# Patient Record
Sex: Female | Born: 1940 | Race: White | Hispanic: No | Marital: Married | Smoking: Former smoker
Health system: Southern US, Community
[De-identification: ages and names within clinical notes are randomized; demographics above are authoritative.]

## PROBLEM LIST (undated history)

## (undated) DIAGNOSIS — F419 Anxiety disorder, unspecified: Secondary | ICD-10-CM

## (undated) DIAGNOSIS — R251 Tremor, unspecified: Secondary | ICD-10-CM

## (undated) DIAGNOSIS — I10 Essential (primary) hypertension: Secondary | ICD-10-CM

## (undated) DIAGNOSIS — G20A1 Parkinson's disease without dyskinesia, without mention of fluctuations: Secondary | ICD-10-CM

## (undated) DIAGNOSIS — R42 Dizziness and giddiness: Secondary | ICD-10-CM

## (undated) DIAGNOSIS — E785 Hyperlipidemia, unspecified: Secondary | ICD-10-CM

## (undated) DIAGNOSIS — E039 Hypothyroidism, unspecified: Secondary | ICD-10-CM

## (undated) HISTORY — DX: Tremor, unspecified: R25.1

## (undated) HISTORY — DX: Anxiety disorder, unspecified: F41.9

## (undated) HISTORY — PX: DILATION AND CURETTAGE OF UTERUS: SHX78

## (undated) HISTORY — DX: Dizziness and giddiness: R42

## (undated) HISTORY — PX: WISDOM TOOTH EXTRACTION: SHX21

## (undated) HISTORY — DX: Hyperlipidemia, unspecified: E78.5

---

## 2011-05-12 DIAGNOSIS — E039 Hypothyroidism, unspecified: Secondary | ICD-10-CM | POA: Diagnosis not present

## 2011-05-12 DIAGNOSIS — J90 Pleural effusion, not elsewhere classified: Secondary | ICD-10-CM | POA: Diagnosis not present

## 2011-05-12 DIAGNOSIS — J180 Bronchopneumonia, unspecified organism: Secondary | ICD-10-CM | POA: Diagnosis not present

## 2011-06-22 DIAGNOSIS — E039 Hypothyroidism, unspecified: Secondary | ICD-10-CM | POA: Diagnosis not present

## 2011-06-22 DIAGNOSIS — E7800 Pure hypercholesterolemia, unspecified: Secondary | ICD-10-CM | POA: Diagnosis not present

## 2011-06-22 DIAGNOSIS — I10 Essential (primary) hypertension: Secondary | ICD-10-CM | POA: Diagnosis not present

## 2011-06-25 DIAGNOSIS — I10 Essential (primary) hypertension: Secondary | ICD-10-CM | POA: Diagnosis not present

## 2011-06-25 DIAGNOSIS — E7800 Pure hypercholesterolemia, unspecified: Secondary | ICD-10-CM | POA: Diagnosis not present

## 2011-06-25 DIAGNOSIS — R51 Headache: Secondary | ICD-10-CM | POA: Diagnosis not present

## 2011-06-25 DIAGNOSIS — E669 Obesity, unspecified: Secondary | ICD-10-CM | POA: Diagnosis not present

## 2011-06-25 DIAGNOSIS — F411 Generalized anxiety disorder: Secondary | ICD-10-CM | POA: Diagnosis not present

## 2011-06-25 DIAGNOSIS — E039 Hypothyroidism, unspecified: Secondary | ICD-10-CM | POA: Diagnosis not present

## 2011-07-06 DIAGNOSIS — Z8489 Family history of other specified conditions: Secondary | ICD-10-CM | POA: Diagnosis not present

## 2011-08-06 DIAGNOSIS — E039 Hypothyroidism, unspecified: Secondary | ICD-10-CM | POA: Diagnosis not present

## 2011-08-10 DIAGNOSIS — R109 Unspecified abdominal pain: Secondary | ICD-10-CM | POA: Diagnosis not present

## 2011-08-10 DIAGNOSIS — N3090 Cystitis, unspecified without hematuria: Secondary | ICD-10-CM | POA: Diagnosis not present

## 2011-08-24 DIAGNOSIS — K5732 Diverticulitis of large intestine without perforation or abscess without bleeding: Secondary | ICD-10-CM | POA: Diagnosis not present

## 2011-11-05 DIAGNOSIS — E039 Hypothyroidism, unspecified: Secondary | ICD-10-CM | POA: Diagnosis not present

## 2011-11-05 DIAGNOSIS — I10 Essential (primary) hypertension: Secondary | ICD-10-CM | POA: Diagnosis not present

## 2011-11-05 DIAGNOSIS — E7800 Pure hypercholesterolemia, unspecified: Secondary | ICD-10-CM | POA: Diagnosis not present

## 2011-11-11 DIAGNOSIS — E039 Hypothyroidism, unspecified: Secondary | ICD-10-CM | POA: Diagnosis not present

## 2011-11-11 DIAGNOSIS — E7800 Pure hypercholesterolemia, unspecified: Secondary | ICD-10-CM | POA: Diagnosis not present

## 2011-11-11 DIAGNOSIS — E669 Obesity, unspecified: Secondary | ICD-10-CM | POA: Diagnosis not present

## 2011-11-11 DIAGNOSIS — I10 Essential (primary) hypertension: Secondary | ICD-10-CM | POA: Diagnosis not present

## 2012-01-25 DIAGNOSIS — Z23 Encounter for immunization: Secondary | ICD-10-CM | POA: Diagnosis not present

## 2012-03-14 DIAGNOSIS — I10 Essential (primary) hypertension: Secondary | ICD-10-CM | POA: Diagnosis not present

## 2012-03-14 DIAGNOSIS — E7800 Pure hypercholesterolemia, unspecified: Secondary | ICD-10-CM | POA: Diagnosis not present

## 2012-03-14 DIAGNOSIS — E039 Hypothyroidism, unspecified: Secondary | ICD-10-CM | POA: Diagnosis not present

## 2012-03-21 DIAGNOSIS — F411 Generalized anxiety disorder: Secondary | ICD-10-CM | POA: Diagnosis not present

## 2012-03-21 DIAGNOSIS — I10 Essential (primary) hypertension: Secondary | ICD-10-CM | POA: Diagnosis not present

## 2012-03-21 DIAGNOSIS — E7800 Pure hypercholesterolemia, unspecified: Secondary | ICD-10-CM | POA: Diagnosis not present

## 2012-03-21 DIAGNOSIS — E039 Hypothyroidism, unspecified: Secondary | ICD-10-CM | POA: Diagnosis not present

## 2012-07-25 DIAGNOSIS — I10 Essential (primary) hypertension: Secondary | ICD-10-CM | POA: Diagnosis not present

## 2012-07-25 DIAGNOSIS — E7800 Pure hypercholesterolemia, unspecified: Secondary | ICD-10-CM | POA: Diagnosis not present

## 2012-08-01 DIAGNOSIS — E039 Hypothyroidism, unspecified: Secondary | ICD-10-CM | POA: Diagnosis not present

## 2012-08-01 DIAGNOSIS — F411 Generalized anxiety disorder: Secondary | ICD-10-CM | POA: Diagnosis not present

## 2012-08-01 DIAGNOSIS — I10 Essential (primary) hypertension: Secondary | ICD-10-CM | POA: Diagnosis not present

## 2012-08-01 DIAGNOSIS — N189 Chronic kidney disease, unspecified: Secondary | ICD-10-CM | POA: Diagnosis not present

## 2012-08-01 DIAGNOSIS — E7800 Pure hypercholesterolemia, unspecified: Secondary | ICD-10-CM | POA: Diagnosis not present

## 2012-08-08 DIAGNOSIS — M79609 Pain in unspecified limb: Secondary | ICD-10-CM | POA: Diagnosis not present

## 2012-12-05 DIAGNOSIS — E7800 Pure hypercholesterolemia, unspecified: Secondary | ICD-10-CM | POA: Diagnosis not present

## 2012-12-05 DIAGNOSIS — I10 Essential (primary) hypertension: Secondary | ICD-10-CM | POA: Diagnosis not present

## 2012-12-23 DIAGNOSIS — E039 Hypothyroidism, unspecified: Secondary | ICD-10-CM | POA: Diagnosis not present

## 2012-12-23 DIAGNOSIS — N189 Chronic kidney disease, unspecified: Secondary | ICD-10-CM | POA: Diagnosis not present

## 2012-12-23 DIAGNOSIS — Z23 Encounter for immunization: Secondary | ICD-10-CM | POA: Diagnosis not present

## 2012-12-23 DIAGNOSIS — F411 Generalized anxiety disorder: Secondary | ICD-10-CM | POA: Diagnosis not present

## 2012-12-23 DIAGNOSIS — I10 Essential (primary) hypertension: Secondary | ICD-10-CM | POA: Diagnosis not present

## 2012-12-23 DIAGNOSIS — E7800 Pure hypercholesterolemia, unspecified: Secondary | ICD-10-CM | POA: Diagnosis not present

## 2013-02-08 DIAGNOSIS — Z23 Encounter for immunization: Secondary | ICD-10-CM | POA: Diagnosis not present

## 2013-04-18 DIAGNOSIS — E039 Hypothyroidism, unspecified: Secondary | ICD-10-CM | POA: Diagnosis not present

## 2013-04-18 DIAGNOSIS — E7800 Pure hypercholesterolemia, unspecified: Secondary | ICD-10-CM | POA: Diagnosis not present

## 2013-04-18 DIAGNOSIS — E669 Obesity, unspecified: Secondary | ICD-10-CM | POA: Diagnosis not present

## 2013-04-18 DIAGNOSIS — I10 Essential (primary) hypertension: Secondary | ICD-10-CM | POA: Diagnosis not present

## 2013-04-27 DIAGNOSIS — N189 Chronic kidney disease, unspecified: Secondary | ICD-10-CM | POA: Diagnosis not present

## 2013-04-27 DIAGNOSIS — L570 Actinic keratosis: Secondary | ICD-10-CM | POA: Diagnosis not present

## 2013-04-27 DIAGNOSIS — Z23 Encounter for immunization: Secondary | ICD-10-CM | POA: Diagnosis not present

## 2013-04-27 DIAGNOSIS — E039 Hypothyroidism, unspecified: Secondary | ICD-10-CM | POA: Diagnosis not present

## 2013-04-27 DIAGNOSIS — I10 Essential (primary) hypertension: Secondary | ICD-10-CM | POA: Diagnosis not present

## 2013-04-27 DIAGNOSIS — K2100 Gastro-esophageal reflux disease with esophagitis, without bleeding: Secondary | ICD-10-CM | POA: Diagnosis not present

## 2013-04-27 DIAGNOSIS — F411 Generalized anxiety disorder: Secondary | ICD-10-CM | POA: Diagnosis not present

## 2013-04-27 DIAGNOSIS — E7800 Pure hypercholesterolemia, unspecified: Secondary | ICD-10-CM | POA: Diagnosis not present

## 2013-10-26 DIAGNOSIS — E669 Obesity, unspecified: Secondary | ICD-10-CM | POA: Diagnosis not present

## 2013-10-26 DIAGNOSIS — E7800 Pure hypercholesterolemia, unspecified: Secondary | ICD-10-CM | POA: Diagnosis not present

## 2013-10-26 DIAGNOSIS — E039 Hypothyroidism, unspecified: Secondary | ICD-10-CM | POA: Diagnosis not present

## 2013-10-26 DIAGNOSIS — I10 Essential (primary) hypertension: Secondary | ICD-10-CM | POA: Diagnosis not present

## 2013-10-30 DIAGNOSIS — E039 Hypothyroidism, unspecified: Secondary | ICD-10-CM | POA: Diagnosis not present

## 2013-10-30 DIAGNOSIS — I10 Essential (primary) hypertension: Secondary | ICD-10-CM | POA: Diagnosis not present

## 2013-10-30 DIAGNOSIS — E7800 Pure hypercholesterolemia, unspecified: Secondary | ICD-10-CM | POA: Diagnosis not present

## 2013-10-30 DIAGNOSIS — E669 Obesity, unspecified: Secondary | ICD-10-CM | POA: Diagnosis not present

## 2013-10-30 DIAGNOSIS — K2100 Gastro-esophageal reflux disease with esophagitis, without bleeding: Secondary | ICD-10-CM | POA: Diagnosis not present

## 2013-10-30 DIAGNOSIS — F411 Generalized anxiety disorder: Secondary | ICD-10-CM | POA: Diagnosis not present

## 2013-10-30 DIAGNOSIS — N189 Chronic kidney disease, unspecified: Secondary | ICD-10-CM | POA: Diagnosis not present

## 2013-11-20 DIAGNOSIS — J019 Acute sinusitis, unspecified: Secondary | ICD-10-CM | POA: Diagnosis not present

## 2013-11-20 DIAGNOSIS — J069 Acute upper respiratory infection, unspecified: Secondary | ICD-10-CM | POA: Diagnosis not present

## 2013-12-16 ENCOUNTER — Emergency Department (HOSPITAL_COMMUNITY): Payer: Medicare Other

## 2013-12-16 ENCOUNTER — Encounter (HOSPITAL_COMMUNITY): Payer: Self-pay | Admitting: Emergency Medicine

## 2013-12-16 ENCOUNTER — Inpatient Hospital Stay (HOSPITAL_COMMUNITY)
Admission: EM | Admit: 2013-12-16 | Discharge: 2013-12-18 | DRG: 310 | Disposition: A | Discharge status: Home / Self Care | Payer: Medicare Other | Attending: Internal Medicine | Admitting: Internal Medicine

## 2013-12-16 DIAGNOSIS — I471 Supraventricular tachycardia, unspecified: Principal | ICD-10-CM | POA: Diagnosis present

## 2013-12-16 DIAGNOSIS — E876 Hypokalemia: Secondary | ICD-10-CM | POA: Diagnosis present

## 2013-12-16 DIAGNOSIS — I509 Heart failure, unspecified: Secondary | ICD-10-CM | POA: Diagnosis not present

## 2013-12-16 DIAGNOSIS — T502X5A Adverse effect of carbonic-anhydrase inhibitors, benzothiadiazides and other diuretics, initial encounter: Secondary | ICD-10-CM | POA: Diagnosis present

## 2013-12-16 DIAGNOSIS — R002 Palpitations: Secondary | ICD-10-CM

## 2013-12-16 DIAGNOSIS — E039 Hypothyroidism, unspecified: Secondary | ICD-10-CM | POA: Diagnosis present

## 2013-12-16 DIAGNOSIS — Z87891 Personal history of nicotine dependence: Secondary | ICD-10-CM

## 2013-12-16 DIAGNOSIS — R911 Solitary pulmonary nodule: Secondary | ICD-10-CM | POA: Diagnosis not present

## 2013-12-16 DIAGNOSIS — I10 Essential (primary) hypertension: Secondary | ICD-10-CM | POA: Diagnosis present

## 2013-12-16 DIAGNOSIS — E785 Hyperlipidemia, unspecified: Secondary | ICD-10-CM | POA: Diagnosis present

## 2013-12-16 DIAGNOSIS — I498 Other specified cardiac arrhythmias: Secondary | ICD-10-CM | POA: Insufficient documentation

## 2013-12-16 DIAGNOSIS — I499 Cardiac arrhythmia, unspecified: Secondary | ICD-10-CM | POA: Diagnosis not present

## 2013-12-16 HISTORY — DX: Hypothyroidism, unspecified: E03.9

## 2013-12-16 HISTORY — DX: Essential (primary) hypertension: I10

## 2013-12-16 LAB — CBC
HCT: 41 % (ref 36.0–46.0)
HEMATOCRIT: 40.3 % (ref 36.0–46.0)
HEMOGLOBIN: 13.7 g/dL (ref 12.0–15.0)
Hemoglobin: 13.8 g/dL (ref 12.0–15.0)
MCH: 30 pg (ref 26.0–34.0)
MCH: 29.9 pg (ref 26.0–34.0)
MCHC: 34 g/dL (ref 30.0–36.0)
MCHC: 33.7 g/dL (ref 30.0–36.0)
MCV: 88.4 fL (ref 78.0–100.0)
MCV: 88.9 fL (ref 78.0–100.0)
PLATELETS: 222 10*3/uL (ref 150–400)
Platelets: 230 10*3/uL (ref 150–400)
RBC: 4.56 MIL/uL (ref 3.87–5.11)
RBC: 4.61 MIL/uL (ref 3.87–5.11)
RDW: 13.6 % (ref 11.5–15.5)
RDW: 13.7 % (ref 11.5–15.5)
WBC: 5.9 10*3/uL (ref 4.0–10.5)
WBC: 6.8 10*3/uL (ref 4.0–10.5)

## 2013-12-16 LAB — BASIC METABOLIC PANEL
Anion gap: 14 (ref 5–15)
BUN: 17 mg/dL (ref 6–23)
CO2: 24 meq/L (ref 19–32)
CREATININE: 0.92 mg/dL (ref 0.50–1.10)
Calcium: 9.4 mg/dL (ref 8.4–10.5)
Chloride: 104 mEq/L (ref 96–112)
GFR calc Af Amer: 70 mL/min — ABNORMAL LOW (ref >90)
GFR, EST NON AFRICAN AMERICAN: 60 mL/min — AB (ref >90)
GLUCOSE: 112 mg/dL — AB (ref 70–99)
Potassium: 3.3 mEq/L — ABNORMAL LOW (ref 3.7–5.3)
Sodium: 142 mEq/L (ref 137–147)

## 2013-12-16 LAB — TROPONIN I

## 2013-12-16 LAB — PRO B NATRIURETIC PEPTIDE: Pro B Natriuretic peptide (BNP): <5 pg/mL (ref 0–125)

## 2013-12-16 LAB — MAGNESIUM: Magnesium: 2.2 mg/dL (ref 1.5–2.5)

## 2013-12-16 MED ORDER — SODIUM CHLORIDE 0.9 % IJ SOLN
3.0000 mL | Freq: Two times a day (BID) | INTRAMUSCULAR | Status: DC
  Administered 2013-12-17 – 2013-12-18 (×3): 3 mL via INTRAVENOUS

## 2013-12-16 MED ORDER — HYDRALAZINE HCL 25 MG PO TABS: 25.0000 mg | ORAL_TABLET | Freq: Four times a day (QID) | ORAL | Status: DC | PRN

## 2013-12-16 MED ORDER — POTASSIUM CHLORIDE CRYS ER 20 MEQ PO TBCR
20.0000 meq | EXTENDED_RELEASE_TABLET | Freq: Every day | ORAL | Status: AC
  Administered 2013-12-17 – 2013-12-18 (×2): 20 meq via ORAL
  Filled 2013-12-16 (×2): qty 1

## 2013-12-16 MED ORDER — SODIUM CHLORIDE 0.9 % IJ SOLN: 3.0000 mL | INTRAMUSCULAR | Status: DC | PRN

## 2013-12-16 MED ORDER — GI COCKTAIL ~~LOC~~
30.0000 mL | Freq: Once | ORAL | Status: AC
  Administered 2013-12-16: 30 mL via ORAL
  Filled 2013-12-16: qty 30

## 2013-12-16 MED ORDER — SODIUM CHLORIDE 0.9 % IJ SOLN: 3.0000 mL | Freq: Two times a day (BID) | INTRAMUSCULAR | Status: DC

## 2013-12-16 MED ORDER — ONDANSETRON HCL 4 MG PO TABS: 4.0000 mg | ORAL_TABLET | Freq: Four times a day (QID) | ORAL | Status: DC | PRN

## 2013-12-16 MED ORDER — HYDROCHLOROTHIAZIDE 12.5 MG PO CAPS
12.5000 mg | ORAL_CAPSULE | Freq: Every day | ORAL | Status: DC
  Administered 2013-12-17 – 2013-12-18 (×2): 12.5 mg via ORAL
  Filled 2013-12-16 (×2): qty 1

## 2013-12-16 MED ORDER — POTASSIUM CHLORIDE 20 MEQ/15ML (10%) PO LIQD
40.0000 meq | Freq: Once | ORAL | Status: AC
  Administered 2013-12-16: 40 meq via ORAL
  Filled 2013-12-16: qty 30

## 2013-12-16 MED ORDER — LEVOTHYROXINE SODIUM 75 MCG PO TABS
75.0000 ug | ORAL_TABLET | Freq: Every day | ORAL | Status: DC
  Administered 2013-12-17 – 2013-12-18 (×2): 75 ug via ORAL
  Filled 2013-12-16 (×2): qty 1

## 2013-12-16 MED ORDER — ACETAMINOPHEN 325 MG PO TABS: 650.0000 mg | ORAL_TABLET | Freq: Four times a day (QID) | ORAL | Status: DC | PRN

## 2013-12-16 MED ORDER — ACETAMINOPHEN 650 MG RE SUPP: 650.0000 mg | Freq: Four times a day (QID) | RECTAL | Status: DC | PRN

## 2013-12-16 MED ORDER — ONDANSETRON HCL 4 MG/2ML IJ SOLN: 4.0000 mg | Freq: Four times a day (QID) | INTRAMUSCULAR | Status: DC | PRN

## 2013-12-16 MED ORDER — ENOXAPARIN SODIUM 40 MG/0.4ML ~~LOC~~ SOLN
40.0000 mg | SUBCUTANEOUS | Status: DC
  Administered 2013-12-17 – 2013-12-18 (×2): 40 mg via SUBCUTANEOUS
  Filled 2013-12-16 (×2): qty 0.4

## 2013-12-16 MED ORDER — ATORVASTATIN CALCIUM 10 MG PO TABS
10.0000 mg | ORAL_TABLET | Freq: Every day | ORAL | Status: DC
  Administered 2013-12-17: 10 mg via ORAL
  Filled 2013-12-16: qty 1

## 2013-12-16 MED ORDER — METOPROLOL TARTRATE 25 MG PO TABS
25.0000 mg | ORAL_TABLET | Freq: Two times a day (BID) | ORAL | Status: DC
  Administered 2013-12-17 – 2013-12-18 (×3): 25 mg via ORAL
  Filled 2013-12-16 (×3): qty 1

## 2013-12-16 MED ORDER — SODIUM CHLORIDE 0.9 % IV SOLN: 250.0000 mL | INTRAVENOUS | Status: DC | PRN

## 2013-12-16 NOTE — ED Notes (Signed)
Pt ambulated around nurses station x 2, monitor showed ST with rate of 110 during ambulation.  Pt reports worsening cough with ambulation.  States heart palpitations are felt after sitting down.  Pt denied palpitations after sitting, HR 120 while sitting down after ambulation, lasting briefly for approx 15 sec before coming back down to 110.  nad noted.

## 2013-12-16 NOTE — ED Notes (Signed)
Pt also reports non-productive cough x 2 weeks, denies fevers.

## 2013-12-16 NOTE — ED Notes (Signed)
pts husband to desk stating alarm was going off on monitor.  Upon reviewing monitor, pt had 2 separate episodes of SVT/A-fib lasting approx 3 secs.  Strips printed and edp notified.  Pt in ST at this time with HR 105.

## 2013-12-16 NOTE — ED Notes (Signed)
Report given to Jennifer RN on 300 

## 2013-12-16 NOTE — ED Provider Notes (Signed)
CSN: [id number]     Arrival date & time 12/16/13  1512 History   First MD Initiated Contact with Patient 12/16/13 1624     Chief Complaint  Patient presents with  . Palpitations      HPI Pt was seen at 1625. Per pt and her family, c/o gradual onset and worsening of multiple intermittent episodes of "palpitations" for the past 2 days. Pt states her symptoms were worse overnight last night and she did not sleep well. Describes the palpitations as "my heart beats fast then slows down again" and "my heart does flip-flops." Has been associated with DOE and cough. States "the palpitations make me feel like I have to cough," which also worsens on exertion.  Denies CP, no abd pain, no N/V/D, no back pain, no fevers.    Past Medical History  Diagnosis Date  . Hypertension   . Hypothyroidism    History reviewed. No pertinent past surgical history.  History  Substance Use Topics  . Smoking status: Never Smoker   . Smokeless tobacco: Not on file  . Alcohol Use: No    Review of Systems ROS: Statement: All systems negative except as marked or noted in the HPI; Constitutional: Negative for fever and chills. ; ; Eyes: Negative for eye pain, redness and discharge. ; ; ENMT: Negative for ear pain, hoarseness, nasal congestion, sinus pressure and sore throat. ; ; Cardiovascular: +palpitations, DOE. Negative for chest pain, diaphoresis, and peripheral edema. ; ; Respiratory: +cough. Negative for wheezing and stridor. ; ; Gastrointestinal: Negative for nausea, vomiting, diarrhea, abdominal pain, blood in stool, hematemesis, jaundice and rectal bleeding. . ; ; Genitourinary: Negative for dysuria, flank pain and hematuria. ; ; Musculoskeletal: Negative for back pain and neck pain. Negative for swelling and trauma.; ; Skin: Negative for pruritus, rash, abrasions, blisters, bruising and skin lesion.; ; Neuro: Negative for headache, lightheadedness and neck stiffness. Negative for weakness, altered level of  consciousness , altered mental status, extremity weakness, paresthesias, involuntary movement, seizure and syncope.      Allergies  Review of patient's allergies indicates no known allergies.  Home Medications   Prior to Admission medications   Medication Sig Start Date End Date Taking? Authorizing Provider  CRESTOR 5 MG tablet Take 5 mg by mouth daily. 11/20/13  Yes Historical Provider, MD  levothyroxine (SYNTHROID, LEVOTHROID) 75 MCG tablet Take 75 mcg by mouth daily. 11/20/13  Yes Historical Provider, MD  losartan-hydrochlorothiazide (HYZAAR) 100-25 MG per tablet Take 1 tablet by mouth daily. 09/09/13  Yes Historical Provider, MD   BP 160/81  Pulse 96  Temp(Src) 98.3 F (36.8 C) (Oral)  Resp 23  Ht 5' (1.524 m)  Wt 160 lb (72.576 kg)  BMI 31.25 kg/m2  SpO2 97% Physical Exam 1630: Physical examination:  Nursing notes reviewed; Vital signs and O2 SAT reviewed;  Constitutional: Well developed, Well nourished, Well hydrated, In no acute distress; Head:  Normocephalic, atraumatic; Eyes: EOMI, PERRL, No scleral icterus; ENMT: Mouth and pharynx normal, Mucous membranes moist; Neck: Supple, Full range of motion, No lymphadenopathy; Cardiovascular: Regular rate and rhythm, No gallop; Respiratory: Breath sounds clear & equal bilaterally, No wheezes.  Speaking full sentences with ease, Normal respiratory effort/excursion; Chest: Nontender, Movement normal; Abdomen: Soft, Nontender, Nondistended, Normal bowel sounds; Genitourinary: No CVA tenderness; Extremities: Pulses normal, No tenderness, No edema, No calf edema or asymmetry.; Neuro: AA&Ox3, Major CN grossly intact.  Speech clear. No gross focal motor or sensory deficits in extremities.; Skin: Color normal, Warm, Dry.  ED Course  Procedures     EKG Interpretation   Date/Time:  Saturday December 16 2013 15:22:17 EDT Ventricular Rate:  98 PR Interval:  150 QRS Duration: 99 QT Interval:  357 QTC Calculation: 456 R Axis:   -68 Text  Interpretation:  Sinus rhythm Left axis deviation Probable left  atrial enlargement Abnormal R-wave progression, late transition Left  ventricular hypertrophy Inferior infarct, old No old tracing to compare  Confirmed by [facility]  MD, Nicholos Johns [id number]) on 12/16/2013 4:41:34 PM      MDM  MDM Reviewed: nursing note and vitals Interpretation: labs, ECG and x-ray    Results for orders placed during the hospital encounter of 12/16/13  CBC      Result Value Ref Range   WBC 5.9  4.0 - 10.5 K/uL   RBC 4.56  3.87 - 5.11 MIL/uL   Hemoglobin 13.7  12.0 - 15.0 g/dL   HCT 40.9  81.1 - 91.4 %   MCV 88.4  78.0 - 100.0 fL   MCH 30.0  26.0 - 34.0 pg   MCHC 34.0  30.0 - 36.0 g/dL   RDW 78.2  95.6 - 21.3 %   Platelets 230  150 - 400 K/uL  BASIC METABOLIC PANEL      Result Value Ref Range   Sodium 142  137 - 147 mEq/L   Potassium 3.3 (*) 3.7 - 5.3 mEq/L   Chloride 104  96 - 112 mEq/L   CO2 24  19 - 32 mEq/L   Glucose, Bld 112 (*) 70 - 99 mg/dL   BUN 17  6 - 23 mg/dL   Creatinine, Ser 0.86  0.50 - 1.10 mg/dL   Calcium 9.4  8.4 - 57.8 mg/dL   GFR calc non Af Amer 60 (*) >90 mL/min   GFR calc Af Amer 70 (*) >90 mL/min   Anion gap 14  5 - 15  TROPONIN I      Result Value Ref Range   Troponin I <0.30  <0.30 ng/mL  PRO B NATRIURETIC PEPTIDE      Result Value Ref Range   Pro B Natriuretic peptide (BNP) <5.0  0 - 125 pg/mL   Dg Chest Port 1 View 12/16/2013   CLINICAL DATA:  Heart palpitations  EXAM: PORTABLE CHEST - 1 VIEW  COMPARISON:  None.  FINDINGS: The heart size and mediastinal contours are within normal limits. Both lungs are clear. The visualized skeletal structures show scoliosis of the thoracic spine concave to the left.  IMPRESSION: No active disease.   Electronically Signed   By: Alcide Clever M.D.   On: 12/16/2013 16:00     1825:  Will dose PO potassium. Magnesium and TSH levels added to labs. Pt ambulated with heart monitor. Per ED RN: pt remained sinus tachycardia rate 110's while  walking, but pt had a near continuous cough. Pt then settled back on stretcher with monitor sinus tachycardia rate 120's. Pt's husband called ED RN back into exam room, as pt was c/o "palpitations." HR on monitor 140-160's with new narrow complex tachycardia and new deep T-wave inversions Lead II.  Monitor then returned to NSR, rate 90's. Unable to capture arrythmia by 12-lead EKG. Monitor rhythm strips printed out.  DDx PAF vs PSVT.  T/C to [facility] Cardiology Dr. Tresa Endo, case discussed, including:  HPI, pertinent PM/SHx, VS/PE, dx testing, ED course and treatment:  Admission vs d/c with outpt Cards f/u depending on pt and family's comfort; if pt is uncomfortable with d/c, then  he is agreeable to consult, requests to transfer to [facility] under medicine service.  Dx and testing, as well as d/w Cards MD, d/w pt and family.  Questions answered.  Verb understanding. States they would like to talk privately.  1910:   ED RN and I called back into pt's room: pt and family uncomfortable with d/c given her worsening symptoms overnight last night and today, request admission +/- transfer to [facility] prn. T/C to [facility] Dr. Sharl Ma, case discussed, including:  HPI, pertinent PM/SHx, VS/PE, dx testing, ED course and treatment:  Agreeable to come to ED to evaluate pt for admission at [facility] vs [facility].   1940:  T/C from [facility] Dr. Sharl Ma: he has evaluated pt, states he will keep pt at [facility] for tonight for monitoring and electrolyte replacement, [facility] can transfer prn. Family and pt are agreeable with this plan. Dr. Sharl Ma requests to place temp admission orders, tele bed, team 1.    Samuel Jester, DO 12/18/13 1622

## 2013-12-16 NOTE — ED Notes (Signed)
Pt reports intermittent "fluttering" sensation since last night. Pt reports dyspnea with exertion. nad noted.

## 2013-12-16 NOTE — H&P (Addendum)
PCP:   Estanislado Pandy, MD   Chief Complaint:  Palpitations  HPI: 73 year old female who   has a past medical history of Hypertension and Hypothyroidism. Presents to the ED with chief complaint of palpitations which has been intermittent for the possible days. The symptoms were worse last night and could not sleep well. She denies passing out no chest pain or shortness of breath. But she does have cough and some dyspnea on exertion. Patient was diagnosed with bronchitis a few weeks ago and was prescribed Levaquin. She also has history of hypothyroidism, and takes Synthroid 75 mcg daily. Patient is not aware of last TSH value. No TSH Result found in the epic. Patient also takes losartan/HCTZ and today her potassium was 3.3. In the ED patient was made to walk, and a heart rate went up to 120, and then came back down to 110. She did not pass out our experience chest pain.  Allergies:  No Known Allergies    Past Medical History  Diagnosis Date  . Hypertension   . Hypothyroidism     History reviewed. No pertinent past surgical history.  Prior to Admission medications   Medication Sig Start Date End Date Taking? Authorizing Provider  CRESTOR 5 MG tablet Take 5 mg by mouth daily. 11/20/13  Yes Historical Provider, MD  levothyroxine (SYNTHROID, LEVOTHROID) 75 MCG tablet Take 75 mcg by mouth daily. 11/20/13  Yes Historical Provider, MD  losartan-hydrochlorothiazide (HYZAAR) 100-25 MG per tablet Take 1 tablet by mouth daily. 09/09/13  Yes Historical Provider, MD    Social History:  reports that she has never smoked. She does not have any smokeless tobacco history on file. She reports that she does not drink alcohol or use illicit drugs.  History reviewed. No pertinent family history.   All the positives are listed in BOLD  Review of Systems:  HEENT: Headache, blurred vision, runny nose, sore throat Neck: Hypothyroidism, hyperthyroidism,,lymphadenopathy Chest : Shortness of breath,  history of COPD, Asthma Heart : Chest pain, history of coronary arterey disease GI:  Nausea, vomiting, diarrhea, constipation, GERD GU: Dysuria, urgency, frequency of urination, hematuria Neuro: Stroke, seizures, syncope Psych: Depression, anxiety, hallucinations   Physical Exam: Blood pressure 160/81, pulse 96, temperature 98.3 F (36.8 C), temperature source Oral, resp. rate 23, height 5' (1.524 m), weight 72.576 kg (160 lb), SpO2 97.00%. Constitutional:   Patient is a well-developed and well-nourished female* in no acute distress and cooperative with exam. Head: Normocephalic and atraumatic Mouth: Mucus membranes moist Eyes: PERRL, EOMI, conjunctivae normal Neck: Supple, No Thyromegaly Cardiovascular: RRR, S1 normal, S2 normal Pulmonary/Chest: CTAB, no wheezes, rales, or rhonchi Abdominal: Soft. Non-tender, non-distended, bowel sounds are normal, no masses, organomegaly, or guarding present.  Neurological: A&O x3, Strenght is normal and symmetric bilaterally, cranial nerve II-XII are grossly intact, no focal motor deficit, sensory intact to light touch bilaterally.  Extremities : No Cyanosis, Clubbing or Edema  Labs on Admission:  Basic Metabolic Panel:  Recent Labs Lab 12/16/13 1556  NA 142  K 3.3*  CL 104  CO2 24  GLUCOSE 112*  BUN 17  CREATININE 0.92  CALCIUM 9.4   CBC:  Recent Labs Lab 12/16/13 1556  WBC 5.9  HGB 13.7  HCT 40.3  MCV 88.4  PLT 230   Cardiac Enzymes:  Recent Labs Lab 12/16/13 1556  TROPONINI <0.30    BNP (last 3 results)  Recent Labs  12/16/13 1612  PROBNP <5.0   CBG: No results found for this basename: GLUCAP,  in the last 168 hours  Radiological Exams on Admission: Dg Chest Port 1 View  12/16/2013   CLINICAL DATA:  Heart palpitations  EXAM: PORTABLE CHEST - 1 VIEW  COMPARISON:  None.  FINDINGS: The heart size and mediastinal contours are within normal limits. Both lungs are clear. The visualized skeletal structures show  scoliosis of the thoracic spine concave to the left.  IMPRESSION: No active disease.   Electronically Signed   By: Alcide Clever M.D.   On: 12/16/2013 16:00    EKG: Independently reviewed. Normal sinus rhythm, LVH, borderline QTC prolongation   Assessment/Plan Principal Problem:   Heart palpitations Active Problems:   Paroxysmal cardiac arrhythmia   HTN (hypertension)   Hyperlipidemia  Palpitations Patient started having these symptoms for pulse today's and also has complained of intermittent symptoms in the past. We'll admit the patient, obtain 2-D echocardiogram in a.m.  She also has hypokalemia, likely due to hydrochlorothiazide. We'll replace potassium and check serum magnesium which can cause arrhythmias. Patient also has hypothyroidism and takes Synthroid 75 mcg daily. We'll check TSH. Will also check troponin I every 6 hours x3   Cough  Could be secondary to ARB's, though incidences less with ARB is as compared to ACE inhibitor Will hold losartan  Hypokalemia Replace potassium, and check serum magnesium  Hypothyroidism Check TSH, continue Synthroid 75 mcg daily.  Hypertension Patient is taking losartan HCTZ at home, will hold losartan and cut down the dose of HCTZ to 12.5 mg by mouth daily. Will start metoprolol 25 mg by mouth twice a day, and also add hydralazine 25 mg by mouth every 6 hours when necessary for BP greater than 160/100.  Hyperlipidemia Continue Crestor  DVT prophylaxis Lovenox  Code status: Full code  Family discussion: Admission, patients condition and plan of care including tests being ordered have been discussed with the patient and her niece and husband at bedside* who indicate understanding and agree with the plan and Code Status.  At this time patient is stable, with no acute issues, and do not feel the need to transfer to [place]. Unless patient's condition changes overnight, then we can [facility] her to [facility] as [facility]. I discussed in  detail with patient's family and they're agreeable to stay at [facility] at this time.   Time Spent on Admission: 75 minutes  Alphonza Tramell S [facility] Pager: [phone] 12/16/2013, 7:49 PM  If 7PM-7AM, please contact night-coverage  [email or web address]  Password [email or web address]

## 2013-12-16 NOTE — ED Notes (Signed)
Pt reports heartburn, denies cp.  edp notified, orders received.

## 2013-12-17 DIAGNOSIS — I509 Heart failure, unspecified: Secondary | ICD-10-CM

## 2013-12-17 DIAGNOSIS — I498 Other specified cardiac arrhythmias: Secondary | ICD-10-CM

## 2013-12-17 LAB — COMPREHENSIVE METABOLIC PANEL
ALBUMIN: 3.3 g/dL — AB (ref 3.5–5.2)
ALT: 15 U/L (ref 0–35)
AST: 21 U/L (ref 0–37)
Alkaline Phosphatase: 108 U/L (ref 39–117)
Anion gap: 12 (ref 5–15)
BILIRUBIN TOTAL: 0.3 mg/dL (ref 0.3–1.2)
BUN: 18 mg/dL (ref 6–23)
CHLORIDE: 105 meq/L (ref 96–112)
CO2: 24 mEq/L (ref 19–32)
CREATININE: 0.86 mg/dL (ref 0.50–1.10)
Calcium: 8.8 mg/dL (ref 8.4–10.5)
GFR calc Af Amer: 76 mL/min — ABNORMAL LOW (ref >90)
GFR calc non Af Amer: 65 mL/min — ABNORMAL LOW (ref >90)
Glucose, Bld: 101 mg/dL — ABNORMAL HIGH (ref 70–99)
POTASSIUM: 3.7 meq/L (ref 3.7–5.3)
Sodium: 141 mEq/L (ref 137–147)
TOTAL PROTEIN: 6.5 g/dL (ref 6.0–8.3)

## 2013-12-17 LAB — CREATININE, SERUM
CREATININE: 0.96 mg/dL (ref 0.50–1.10)
GFR calc non Af Amer: 57 mL/min — ABNORMAL LOW (ref >90)
GFR, EST AFRICAN AMERICAN: 66 mL/min — AB (ref >90)

## 2013-12-17 LAB — TSH: TSH: 3.13 u[IU]/mL (ref 0.350–4.500)

## 2013-12-17 LAB — CBC
HCT: 38.1 % (ref 36.0–46.0)
Hemoglobin: 12.6 g/dL (ref 12.0–15.0)
MCH: 29.4 pg (ref 26.0–34.0)
MCHC: 33.1 g/dL (ref 30.0–36.0)
MCV: 89 fL (ref 78.0–100.0)
PLATELETS: 212 10*3/uL (ref 150–400)
RBC: 4.28 MIL/uL (ref 3.87–5.11)
RDW: 13.8 % (ref 11.5–15.5)
WBC: 4.7 10*3/uL (ref 4.0–10.5)

## 2013-12-17 LAB — TROPONIN I
Troponin I: <0.3 ng/mL (ref <0.30)
Troponin I: <0.3 ng/mL (ref <0.30)
Troponin I: <0.3 ng/mL (ref <0.30)

## 2013-12-17 MED ORDER — CALCIUM CARBONATE ANTACID 500 MG PO CHEW
1.0000 | CHEWABLE_TABLET | Freq: Three times a day (TID) | ORAL | Status: DC | PRN
  Administered 2013-12-17 – 2013-12-18 (×2): 200 mg via ORAL
  Filled 2013-12-17 (×2): qty 1

## 2013-12-17 NOTE — Progress Notes (Signed)
Utilization review Completed Shenelle Klas RN BSN   

## 2013-12-17 NOTE — Progress Notes (Signed)
Echocardiogram 2D Echocardiogram has been performed.  Amber Rosales 12/17/2013, 3:58 PM

## 2013-12-17 NOTE — Progress Notes (Signed)
Patient ID: Amber Rosales  female  ZOX:[id number]    DOB: 1941-01-16    DOA: 12/16/2013  PCP: Estanislado Pandy, MD  Assessment/Plan: Principal Problem:   Heart palpitations/  Paroxysmal cardiac arrhythmia : No atrial fibrillation on the monitor, patient reports having palpitations intermittently for last year, used to resolve with coughing but worse over the last couple of days. She denies any chest pain, dyspnea or dizziness. - Continue telemetry, r/o ACS - EKG showed rate 98, normal sinus rhythm - Will check 2-D echocardiogram, TSH. She will likely need further workup with Holter monitor, cardiology evaluation ?EP - Cardiology consulted - Placed on Lopressor 25 mg twice a day, advised to drink less coffee   Active Problems:   HTN (hypertension) - Continue Lopressor, HCTZ    Hyperlipidemia - Continue Lipitor  DVT Prophylaxis: Lovenox  Code Status:  Family Communication: Discussed with patient's daughter and husband at the bedside  Disposition: Hopefully in AM  Consultants:  Cardiology  Procedures:  None  Antibiotics:  None  Subjective: Patient seen and examined, no acute complaints at this time, no palpitations  Objective: Weight change:  No intake or output data in the 24 hours ending 12/17/13 0941 Blood pressure 151/61, pulse 69, temperature 98.3 F (36.8 C), temperature source Oral, resp. rate 20, height 5' (1.524 m), weight 72.576 kg (160 lb), SpO2 97.00%.  Physical Exam: General: Alert and awake, oriented x3, not in any acute distress. CVS: S1-S2 clear, no murmur rubs or gallops Chest: clear to auscultation bilaterally, no wheezing, rales or rhonchi Abdomen: soft nontender, nondistended, normal bowel sounds  Extremities: no cyanosis, clubbing or edema noted bilaterally Neuro: Cranial nerves II-XII intact, no focal neurological deficits  Lab Results: Basic Metabolic Panel:  Recent Labs Lab 12/16/13 1556 12/16/13 1956 12/16/13 2329 12/17/13 0649    NA 142  --   --  141  K 3.3*  --   --  3.7  CL 104  --   --  105  CO2 24  --   --  24  GLUCOSE 112*  --   --  101*  BUN 17  --   --  18  CREATININE 0.92  --  0.96 0.86  CALCIUM 9.4  --   --  8.8  MG  --  2.2  --   --    Liver Function Tests:  Recent Labs Lab 12/17/13 0649  AST 21  ALT 15  ALKPHOS 108  BILITOT 0.3  PROT 6.5  ALBUMIN 3.3*   No results found for this basename: LIPASE, AMYLASE,  in the last 168 hours No results found for this basename: AMMONIA,  in the last 168 hours CBC:  Recent Labs Lab 12/16/13 2329 12/17/13 0649  WBC 6.8 4.7  HGB 13.8 12.6  HCT 41.0 38.1  MCV 88.9 89.0  PLT 222 212   Cardiac Enzymes:  Recent Labs Lab 12/16/13 1556 12/16/13 2329 12/17/13 0649  TROPONINI <0.30 <0.30 <0.30   BNP: No components found with this basename: POCBNP,  CBG: No results found for this basename: GLUCAP,  in the last 168 hours   Micro Results: No results found for this or any previous visit (from the past 240 hour(s)).  Studies/Results: Dg Chest Port 1 View  12/16/2013   CLINICAL DATA:  Heart palpitations  EXAM: PORTABLE CHEST - 1 VIEW  COMPARISON:  None.  FINDINGS: The heart size and mediastinal contours are within normal limits. Both lungs are clear. The visualized skeletal structures show scoliosis of the  thoracic spine concave to the left.  IMPRESSION: No active disease.   Electronically Signed   By: Alcide Clever M.D.   On: 12/16/2013 16:00    Medications: Scheduled Meds: . atorvastatin  10 mg Oral q1800  . enoxaparin (LOVENOX) injection  40 mg Subcutaneous Q24H  . hydrochlorothiazide  12.5 mg Oral Daily  . levothyroxine  75 mcg Oral Q breakfast  . metoprolol tartrate  25 mg Oral BID  . potassium chloride  20 mEq Oral Daily  . sodium chloride  3 mL Intravenous Q12H  . sodium chloride  3 mL Intravenous Q12H      LOS: 1 day   RAI,RIPUDEEP M.D. [facility] 12/17/2013, 9:41 AM Pager: [phone]  If 7PM-7AM, please contact  night-coverage [email or web address] Password [email or web address]  **Disclaimer: This note was dictated with voice recognition software. Similar sounding words can inadvertently be transcribed and this note may contain transcription errors which may not have been corrected upon publication of note.**

## 2013-12-18 ENCOUNTER — Inpatient Hospital Stay (HOSPITAL_COMMUNITY): Payer: Medicare Other

## 2013-12-18 DIAGNOSIS — I471 Supraventricular tachycardia: Secondary | ICD-10-CM | POA: Diagnosis present

## 2013-12-18 LAB — D-DIMER, QUANTITATIVE (NOT AT ARMC): D DIMER QUANT: 0.97 ug{FEU}/mL — AB (ref 0.00–0.48)

## 2013-12-18 MED ORDER — METOPROLOL TARTRATE 25 MG PO TABS: 25.0000 mg | ORAL_TABLET | Freq: Two times a day (BID) | ORAL | Status: DC

## 2013-12-18 MED ORDER — IOHEXOL 350 MG/ML SOLN
100.0000 mL | Freq: Once | INTRAVENOUS | Status: AC | PRN
  Administered 2013-12-18: 100 mL via INTRAVENOUS

## 2013-12-18 MED ORDER — LOSARTAN POTASSIUM 100 MG PO TABS: 100.0000 mg | ORAL_TABLET | Freq: Every day | ORAL | Status: DC

## 2013-12-18 MED ORDER — FUROSEMIDE 20 MG PO TABS: 20.0000 mg | ORAL_TABLET | ORAL | Status: DC | PRN

## 2013-12-18 NOTE — Care Management Note (Signed)
    Page 1 of 1   12/18/2013     4:38:12 PM CARE MANAGEMENT NOTE 12/18/2013  Patient:  Amber Rosales,Amber Rosales   Account Number:  [id number]  Date Initiated:  12/18/2013  Documentation initiated by:  Kathyrn Sheriff  Subjective/Objective Assessment:   Pt is from home with self care.     Action/Plan:   Pt plans to discharge home with self care today. Pt has no CM needs at this time.   Anticipated DC Date:  12/18/2013   Anticipated DC Plan:  HOME/SELF CARE      DC Planning Services  CM consult      Choice offered to / List presented to:             Status of service:  Completed, signed off Medicare Important Message given?   (If response is "NO", the following Medicare IM given date fields will be blank) Date Medicare IM given:   Medicare IM given by:   Date Additional Medicare IM given:   Additional Medicare IM given by:    Discharge Disposition:  HOME/SELF CARE  Per UR Regulation:    If discussed at Long Length of Stay Meetings, dates discussed:    Comments:  12/18/2013 1600 Kathyrn Sheriff, RN, MSN, [facility]

## 2013-12-18 NOTE — Progress Notes (Signed)
IV removed. Discharge instruction reviewed with patient and husband. Understanding verbalized. Taken out via wheelchair for discharge home.

## 2013-12-18 NOTE — Consult Note (Signed)
Consulting cardiologist: Dr Dina Rich   Clinical Summary Amber Rosales is a 73 y.o.female hx of HTN, hypothyroidism admitted with palpitations.Reports history of occasional palpitations for sometime, but significant increase in frequency and severity starting on Friday. Denies any chest pain, no significant SOB. Drinks only one cup of coffee daily, no tea, sodas, energy drinks or EtOH   K 3.3, Mg 2.2, TSH 3.1, pro BNP negative, trop neg x 4.  CXR no acute process EKG SR, incomplete RBBB, LAD, LAE, Q-waves inferior leads, poor R-wave progression Echo LVEF 60-65%, grade I diastolic dysfunction, moderate RA and RV enlargement, normal RV function. Not able to estimate PASP due to inadequate TR jet, normal IVC.   No Known Allergies  Medications Scheduled Medications: . atorvastatin  10 mg Oral q1800  . enoxaparin (LOVENOX) injection  40 mg Subcutaneous Q24H  . hydrochlorothiazide  12.5 mg Oral Daily  . levothyroxine  75 mcg Oral Q breakfast  . metoprolol tartrate  25 mg Oral BID  . sodium chloride  3 mL Intravenous Q12H  . sodium chloride  3 mL Intravenous Q12H     Infusions:     PRN Medications:  sodium chloride, acetaminophen, acetaminophen, calcium carbonate, ondansetron (ZOFRAN) IV, ondansetron, sodium chloride   Past Medical History  Diagnosis Date  . Hypertension   . Hypothyroidism     History reviewed. No pertinent past surgical history.  History reviewed. No pertinent family history.  Social History Amber Rosales reports that she quit smoking about 56 years ago. Her smoking use included Cigarettes. She smoked 0.00 packs per day for 10 years. She does not have any smokeless tobacco history on file. Amber Rosales reports that she does not drink alcohol.  Review of Systems CONSTITUTIONAL: No weight loss, fever, chills, weakness or fatigue.  HEENT: Eyes: No visual loss, blurred vision, double vision or yellow sclerae. No hearing loss, sneezing, congestion, runny  nose or sore throat.  SKIN: No rash or itching.  CARDIOVASCULAR: per HPI.  RESPIRATORY: No shortness of breath, cough or sputum.  GASTROINTESTINAL: No anorexia, nausea, vomiting or diarrhea. No abdominal pain or blood.  GENITOURINARY: no polyuria, no dysuria NEUROLOGICAL: No headache, dizziness, syncope, paralysis, ataxia, numbness or tingling in the extremities. No change in bowel or bladder control.  MUSCULOSKELETAL: No muscle, back pain, joint pain or stiffness.  HEMATOLOGIC: No anemia, bleeding or bruising.  LYMPHATICS: No enlarged nodes. No history of splenectomy.  PSYCHIATRIC: No history of depression or anxiety.      Physical Examination Blood pressure 134/53, pulse 68, temperature 98 F (36.7 C), temperature source Oral, resp. rate 20, height 5' (1.524 m), weight 160 lb (72.576 kg), SpO2 98.00%.  Intake/Output Summary (Last 24 hours) at 12/18/13 1051 Last data filed at 12/17/13 1800  Gross per 24 hour  Intake    480 ml  Output      0 ml  Net    480 ml    HEENT: Sclera clear  Cardiovascular: RRR, no m/r/g, no JVD, no carotid bruits  Respiratory: CTAB  GI: abdomen soft, NT, ND  MSK: no LE edema  Neuro: no focal deficits  Psych: appropriate affect   Lab Results  Basic Metabolic Panel:  Recent Labs Lab 12/16/13 1556 12/16/13 1956 12/16/13 2329 12/17/13 0649  NA 142  --   --  141  K 3.3*  --   --  3.7  CL 104  --   --  105  CO2 24  --   --  24  GLUCOSE 112*  --   --  101*  BUN 17  --   --  18  CREATININE 0.92  --  0.96 0.86  CALCIUM 9.4  --   --  8.8  MG  --  2.2  --   --     Liver Function Tests:  Recent Labs Lab 12/17/13 0649  AST 21  ALT 15  ALKPHOS 108  BILITOT 0.3  PROT 6.5  ALBUMIN 3.3*    CBC:  Recent Labs Lab 12/16/13 1556 12/16/13 2329 12/17/13 0649  WBC 5.9 6.8 4.7  HGB 13.7 13.8 12.6  HCT 40.3 41.0 38.1  MCV 88.4 88.9 89.0  PLT 230 222 212    Cardiac Enzymes:  Recent Labs Lab 12/16/13 1556 12/16/13 2329  12/17/13 0649 12/17/13 1120  TROPONINI <0.30 <0.30 <0.30 <0.30    BNP: No components found with this basename: POCBNP,     Impression/Recommendations 1. Palpitations - PSVT, episodes of narrow complex tach with short RP up to 140 bpm - possibly exacerbated by low K, this has been repleted. Given low K on admission and since we are starting metoprolol as other antihypertensive would not restart her HCTZ. Would offer Lasix  prn swelling as she does have some occasional LE edema - agree with starting metoprolol for suppression - since rhythm identified on telemetry, no indication for home monitor at this time  2. HTN - would stop HCTZ, continue metoprolol and losartan at discharge  3. Right sided chamber enlargement - noted in apical views on echo, less impressive on subcostal views. Normal RV function - unable to measure pulm systolic pressure due to inadequate TR jet - unclear etiology at this time. She is obese with BMI 31, snores and reports some daytime somnolence and has HTN and arrhtyhmias. Should be considered for outpatient sleep test. Also prior hx of COPD, may consider PFTs at some point.   Will sign off of inpatient care. I have called our office for her to follow up with [place] in 2 weeks with NP Junious Silk, M.D., F.A.C.C.

## 2013-12-18 NOTE — Discharge Summary (Signed)
Physician Discharge Summary  Patient ID: Amber Rosales MRN: [id number] DOB/AGE: 73-Oct-1942 73 y.o.  Admit date: 12/16/2013 Discharge date: 12/18/2013  Primary Care Physician:  Estanislado Pandy, MD  Discharge Diagnoses:    . Paroxysmal SVT . Heart palpitations . HTN (hypertension) . Hyperlipidemia  Consults: Cardiology, Dr Wyline Mood  Recommendations for outpatient follow up Patient has a small right upper lobe nodule 7 mm, outpatient surveillance with chest CT commended   Allergies:  No Known Allergies   Discharge Medications:   Medication List    STOP taking these medications       losartan-hydrochlorothiazide 100-25 MG per tablet  Commonly known as:  HYZAAR      TAKE these medications       CRESTOR 5 MG tablet  Generic drug:  rosuvastatin  Take 5 mg by mouth daily.     furosemide 20 MG tablet  Commonly known as:  LASIX  Take 1 tablet (20 mg total) by mouth as needed for fluid or edema.     levothyroxine 75 MCG tablet  Commonly known as:  SYNTHROID, LEVOTHROID  Take 75 mcg by mouth daily.     losartan 100 MG tablet  Commonly known as:  COZAAR  Take 1 tablet (100 mg total) by mouth daily.     metoprolol tartrate 25 MG tablet  Commonly known as:  LOPRESSOR  Take 1 tablet (25 mg total) by mouth 2 (two) times daily.         Brief H and P: For complete details please refer to admission H and P, but in brief 73 year old female who has a past medical history of Hypertension and Hypothyroidism.  Presented to the ED with chief complaint of palpitations which has been intermittent for the possible days. The symptoms were worse last night and could not sleep well. She denies passing out no chest pain or shortness of breath. But she does have cough and some dyspnea on exertion. Patient was diagnosed with bronchitis a few weeks ago and was prescribed Levaquin. She also has history of hypothyroidism, and takes Synthroid 75 mcg daily. Patient is not aware of last TSH value.  Patient also takes losartan/HCTZ and today her potassium was 3.3.  In the ED patient was made to walk, and a heart rate went up to 120, and then came back down to 110. She did not pass out our experience chest pain.   Hospital Course:  Heart palpitations/ paroxysmal SVT  No atrial fibrillation on the monitor, patient reports having palpitations intermittently for last year, used to resolve with coughing but worse over the last couple of days. She denied any chest pain, dyspnea or dizziness. Patient was placed on telemetry to rule out any cardiac arrhythmias. Patient was found to have had episodes of narrow complex tachycardia with short RP up to 140 beats per minute. Per cardiology, possibly exacerbated by hypokalemia. Recommended continuing beta blocker which was started during this admission. HCTZ were discontinued. Patient was recommended to use her Lasix 20 mg as needed for swelling/pedal edema. Per cardiology, continue metoprolol and losartan.  She was ruled out for acute ACS. EKG showed rate 98, normal sinus rhythm. 2-D echo showed EF of 60-65%, grade 1 diastolic dysfunction, moderately dilated right ventricle, right atrium, Patient was recommended outpatient followup.    HTN (hypertension) - Continue Lopressor, losartan  Hyperlipidemia - Continue Lipitor    Day of Discharge BP 146/91  Pulse 77  Temp(Src) 98.4 F (36.9 C) (Oral)  Resp 19  Ht 5' (1.524 m)  Wt 72.576 kg (160 lb)  BMI 31.25 kg/m2  SpO2 98%  Physical Exam: General: Alert and awake oriented x3 not in any acute distress. HEENT: anicteric sclera, pupils reactive to light and accommodation CVS: S1-S2 clear no murmur rubs or gallops Chest: clear to auscultation bilaterally, no wheezing rales or rhonchi Abdomen: soft nontender, nondistended, normal bowel sounds Extremities: no cyanosis, clubbing or edema noted bilaterally Neuro: Cranial nerves II-XII intact, no focal neurological deficits   The results of  significant diagnostics from this hospitalization (including imaging, microbiology, ancillary and laboratory) are listed below for reference.    LAB RESULTS: Basic Metabolic Panel:  Recent Labs Lab 12/16/13 1556 12/16/13 1956 12/16/13 2329 12/17/13 0649  NA 142  --   --  141  K 3.3*  --   --  3.7  CL 104  --   --  105  CO2 24  --   --  24  GLUCOSE 112*  --   --  101*  BUN 17  --   --  18  CREATININE 0.92  --  0.96 0.86  CALCIUM 9.4  --   --  8.8  MG  --  2.2  --   --    Liver Function Tests:  Recent Labs Lab 12/17/13 0649  AST 21  ALT 15  ALKPHOS 108  BILITOT 0.3  PROT 6.5  ALBUMIN 3.3*   No results found for this basename: LIPASE, AMYLASE,  in the last 168 hours No results found for this basename: AMMONIA,  in the last 168 hours CBC:  Recent Labs Lab 12/16/13 2329 12/17/13 0649  WBC 6.8 4.7  HGB 13.8 12.6  HCT 41.0 38.1  MCV 88.9 89.0  PLT 222 212   Cardiac Enzymes:  Recent Labs Lab 12/17/13 0649 12/17/13 1120  TROPONINI <0.30 <0.30   BNP: No components found with this basename: POCBNP,  CBG: No results found for this basename: GLUCAP,  in the last 168 hours  Significant Diagnostic Studies:  Dg Chest Port 1 View  12/16/2013   CLINICAL DATA:  Heart palpitations  EXAM: PORTABLE CHEST - 1 VIEW  COMPARISON:  None.  FINDINGS: The heart size and mediastinal contours are within normal limits. Both lungs are clear. The visualized skeletal structures show scoliosis of the thoracic spine concave to the left.  IMPRESSION: No active disease.   Electronically Signed   By: Alcide Clever M.D.   On: 12/16/2013 16:00    2D ECHO: Study Conclusions  - Left ventricle: The cavity size was normal. Wall thickness was increased increased in a pattern of mild to moderate LVH. Systolic function was normal. The estimated ejection fraction was in the range of 60% to 65%. Doppler parameters are consistent with abnormal left ventricular relaxation (grade 1  diastolic dysfunction). - Aortic valve: Valve area (VTI): 2.42 cm^2. Valve area (Vmax): 2.54 cm^2. - Right ventricle: The cavity size was moderately dilated. - Right atrium: The atrium was moderately to severely dilated. - Technically adequate study.    Disposition and Follow-up: Discharge Instructions   Diet - low sodium heart healthy    Complete by:  As directed      Increase activity slowly    Complete by:  As directed             DISPOSITION: HOME   DIET: HEART HEALTHY    DISCHARGE FOLLOW-UP Follow-up Information   Follow up with Estanislado Pandy, MD On 12/26/2013. (Your appointment is at 11:15 am.)    Specialty:  Family Medicine   Contact information:   [address] [place] [address] [phone]       Follow up with Joni Reining, NP On 01/01/2014. (Your appointment is at 1:30 pm.)    Specialty:  Nurse Practitioner   Contact information:   [address] [place] [address] [phone]       Time spent on Discharge: 35 MINS  Signed:   Joel Mericle M.D. [facility] 12/18/2013, 3:45 PM Pager: [phone]   **Disclaimer: This note was dictated with voice recognition software. Similar sounding words can inadvertently be transcribed and this note may contain transcription errors which may not have been corrected upon publication of note.**

## 2013-12-26 DIAGNOSIS — Z23 Encounter for immunization: Secondary | ICD-10-CM | POA: Diagnosis not present

## 2013-12-29 NOTE — Progress Notes (Signed)
    HPI: Amber Rosales is a 73 year old patient of Dr. Wyline Mood. That we are following for ongoing assessment and management of hypertension, palpitations, with history of hypothyroidism. She was recently admitted to any pain hospital for complaints of increased severity of palpitations without associated chest pain, shortness of breath, or dizziness.  Echocardiogram was completed during hospitalization revealing a normal systolic function with 80 F. of 60-65%, with grade 1 diastolic dysfunction. The right atrium was moderately to severely dilated. The right ventricle cavity size is moderately dilated. There is no evidence of valvular disease.  During hospitalization, the patient was found to have episodes of narrow complex tachycardia with a short RP up to 140 beats per minute. It was felt that this was exacerbated by hypokalemia with a mission, potassium of 3.3. She was continued on metoprolol 25 mg twice a day, along with antihypertensive losartan 100 mg daily. She is also sent home on 20 mg of Lasix. But I do not find a discharge summary that she was also given potassium replacement. On discharge the patient's potassium was 3.7. Creatinine 0.86. She is here for post hospitalization followup.  She denies recurrent palpitations on metoprolol. She has gained 14 lbs by our scales compared to the discharge weight. She has not used the lasix since being discharged. She is having some complaints of DOE, but no chest pain, no edema.   No Known Allergies  Current Outpatient Prescriptions  Medication Sig Dispense Refill  . CRESTOR 5 MG tablet Take 5 mg by mouth daily.      . furosemide (LASIX) 20 MG tablet Take 1 tablet (20 mg total) by mouth as needed for fluid or edema.  30 tablet  2  . levothyroxine (SYNTHROID, LEVOTHROID) 75 MCG tablet Take 75 mcg by mouth daily.      Marland Kitchen losartan (COZAAR) 100 MG tablet Take 1 tablet (100 mg total) by mouth daily.  30 tablet  3  . metoprolol tartrate (LOPRESSOR) 25 MG  tablet Take 1 tablet (25 mg total) by mouth 2 (two) times daily.  60 tablet  3   No current facility-administered medications for this visit.    Past Medical History  Diagnosis Date  . Hypertension   . Hypothyroidism     History reviewed. No pertinent past surgical history.  ROS: Review of systems complete and found to be negative unless listed above  PHYSICAL EXAM BP 158/102  Pulse 73  Ht  (1.549 m)  Wt 174 lb (78.926 kg)  BMI 32.89 kg/m2  SpO2 98%  General: Well developed, well nourished, in no acute distress Head: Eyes PERRLA, No xanthomas.   Normal cephalic and atramatic  Lungs: Clear bilaterally to auscultation and percussion. Heart: HRRR S1 S2, 1/6 systolic murmur,Soft S4.   Pulses are 2+ & equal.            No carotid bruit. No JVD.  No abdominal bruits. No femoral bruits. Abdomen: Bowel sounds are positive, abdomen soft and non-tender without masses or                  Hernia's noted. obese Msk:  Back normal, normal gait. Normal strength and tone for age. Extremities: No clubbing, cyanosis or edema.  DP +1 Neuro: Alert and oriented X 3. Psych:  Good affect, responds appropriately  ASSESSMENT AND PLAN

## 2014-01-01 ENCOUNTER — Encounter: Payer: Self-pay | Admitting: Adult Health

## 2014-01-01 ENCOUNTER — Ambulatory Visit (INDEPENDENT_AMBULATORY_CARE_PROVIDER_SITE_OTHER): Payer: Medicare Other | Admitting: Adult Health

## 2014-01-01 VITALS — BP 158/102 | HR 73 | Ht 61.0 in | Wt 174.0 lb

## 2014-01-01 DIAGNOSIS — I471 Supraventricular tachycardia: Secondary | ICD-10-CM | POA: Diagnosis not present

## 2014-01-01 DIAGNOSIS — E785 Hyperlipidemia, unspecified: Secondary | ICD-10-CM | POA: Diagnosis not present

## 2014-01-01 DIAGNOSIS — I10 Essential (primary) hypertension: Secondary | ICD-10-CM | POA: Diagnosis not present

## 2014-01-01 MED ORDER — HYDROCHLOROTHIAZIDE 25 MG PO TABS: 25.0000 mg | ORAL_TABLET | Freq: Every day | ORAL | Status: DC

## 2014-01-01 NOTE — Assessment & Plan Note (Signed)
Continue low cholesterol diet and crestor. Will recheck status in 3 months.

## 2014-01-01 NOTE — Progress Notes (Deleted)
Name: Amber Rosales    DOB: 07-01-1940  Age: 73 y.o.  MR#: [id number]       PCP:  Estanislado Pandy, MD      Insurance: Payor: MEDICARE / Plan: MEDICARE PART A AND B / Product Type: *No Product type* /   CC:    Chief Complaint  Patient presents with  . Hypertension  . Palpitations    VS Filed Vitals:   01/01/14 1328  BP: 158/102  Pulse: 73  Height:  (1.549 m)  Weight: 174 lb (78.926 kg)  SpO2: 98%    Weights Current Weight  01/01/14 174 lb (78.926 kg)  12/16/13 160 lb (72.576 kg)    Blood Pressure  BP Readings from Last 3 Encounters:  01/01/14 158/102  12/18/13 146/91     Admit date:  (Not on file) Last encounter with RMR:  Visit date not found   Allergy Review of patient's allergies indicates no known allergies.  Current Outpatient Prescriptions  Medication Sig Dispense Refill  . CRESTOR 5 MG tablet Take 5 mg by mouth daily.      . furosemide (LASIX) 20 MG tablet Take 1 tablet (20 mg total) by mouth as needed for fluid or edema.  30 tablet  2  . levothyroxine (SYNTHROID, LEVOTHROID) 75 MCG tablet Take 75 mcg by mouth daily.      Marland Kitchen losartan (COZAAR) 100 MG tablet Take 1 tablet (100 mg total) by mouth daily.  30 tablet  3  . metoprolol tartrate (LOPRESSOR) 25 MG tablet Take 1 tablet (25 mg total) by mouth 2 (two) times daily.  60 tablet  3   No current facility-administered medications for this visit.    Discontinued Meds:   There are no discontinued medications.  Patient Active Problem List   Diagnosis Date Noted  . PSVT (paroxysmal supraventricular tachycardia) 12/18/2013  . 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 Heart palpitations 12/16/2013  . HTN (hypertension) 12/16/2013  . Hyperlipidemia 12/16/2013    LABS    Component Value Date/Time   NA 141 12/17/2013 0649   NA 142 12/16/2013 1556   K 3.7 12/17/2013 0649   K 3.3* 12/16/2013 1556   CL 105 12/17/2013 0649   CL 104 12/16/2013 1556   CO2 24 12/17/2013 0649   CO2 24 12/16/2013 1556   GLUCOSE 101*  12/17/2013 0649   GLUCOSE 112* 12/16/2013 1556   BUN 18 12/17/2013 0649   BUN 17 12/16/2013 1556   CREATININE 0.86 12/17/2013 0649   CREATININE 0.96 12/16/2013 2329   CREATININE 0.92 12/16/2013 1556   CALCIUM 8.8 12/17/2013 0649   CALCIUM 9.4 12/16/2013 1556   GFRNONAA 65* 12/17/2013 0649   GFRNONAA 57* 12/16/2013 2329   GFRNONAA 60* 12/16/2013 1556   GFRAA 76* 12/17/2013 0649   GFRAA 66* 12/16/2013 2329   GFRAA 70* 12/16/2013 1556   CMP     Component Value Date/Time   NA 141 12/17/2013 0649   K 3.7 12/17/2013 0649   CL 105 12/17/2013 0649   CO2 24 12/17/2013 0649   GLUCOSE 101* 12/17/2013 0649   BUN 18 12/17/2013 0649   CREATININE 0.86 12/17/2013 0649   CALCIUM 8.8 12/17/2013 0649   PROT 6.5 12/17/2013 0649   ALBUMIN 3.3* 12/17/2013 0649   AST 21 12/17/2013 0649   ALT 15 12/17/2013 0649   ALKPHOS 108 12/17/2013 0649   BILITOT 0.3 12/17/2013 0649   GFRNONAA 65* 12/17/2013 0649   GFRAA 76* 12/17/2013 0649       Component Value Date/Time  WBC 4.7 12/17/2013 0649   WBC 6.8 12/16/2013 2329   WBC 5.9 12/16/2013 1556   HGB 12.6 12/17/2013 0649   HGB 13.8 12/16/2013 2329   HGB 13.7 12/16/2013 1556   HCT 38.1 12/17/2013 0649   HCT 41.0 12/16/2013 2329   HCT 40.3 12/16/2013 1556   MCV 89.0 12/17/2013 0649   MCV 88.9 12/16/2013 2329   MCV 88.4 12/16/2013 1556    Lipid Panel  No results found for this basename: chol, trig, hdl, cholhdl, vldl, ldlcalc    ABG No results found for this basename: phart, pco2, pco2art, po2, po2art, hco3, tco2, acidbasedef, o2sat     Lab Results  Component Value Date   TSH 3.130 12/16/2013   BNP (last 3 results)  Recent Labs  12/16/13 1612  PROBNP <5.0   Cardiac Panel (last 3 results) No results found for this basename: CKTOTAL, CKMB, TROPONINI, RELINDX,  in the last 72 hours  Iron/TIBC/Ferritin/ %Sat No results found for this basename: iron, tibc, ferritin, ironpctsat     EKG Orders placed during the hospital encounter of 12/16/13  . ED EKG  . ED EKG  . EKG      Prior Assessment and Plan Problem List as of 01/01/2014     Cardiovascular and Mediastinum   Paroxysmal cardiac arrhythmia   HTN (hypertension)   PSVT (paroxysmal supraventricular tachycardia)     Other   Heart palpitations   Hyperlipidemia       Imaging: Ct Angio Chest Pe W/cm &/or Wo Cm  12/18/2013   CLINICAL DATA:  Elevated D-dimer.  Concern for pulmonary embolism  EXAM: CT ANGIOGRAPHY CHEST WITH CONTRAST  TECHNIQUE: Multidetector CT imaging of the chest was performed using the standard protocol during bolus administration of intravenous contrast. Multiplanar CT image reconstructions and MIPs were obtained to evaluate the vascular anatomy.  CONTRAST:  OMNIPAQUE IOHEXOL 350 MG/ML SOLN  COMPARISON:  Chest radiograph 12/16/2013  FINDINGS: There are no filling defects within the pulmonary arteries to suggest acute pulmonary embolism. No acute findings aorta great vessels. No pericardial fluid. Esophagus is normal.  Review of the lung parenchyma demonstrates a ground-glass right upper lobe measures 7 mm (image 53, series 5).  Limited view of the upper abdomen unremarkable. Adrenal glands normal. The mediastinal lymphadenopathy no aggressive osseous lesion.  Review of the MIP images confirms the above findings.  IMPRESSION: 1. No evidence of acute pulmonary embolism. 2. Sub solid nodule in the right upper lobe may be a focus of infection or inflammation. Initial follow-up by chest CT without contrast is recommended in 3 months to confirm persistence. This recommendation follows the consensus statement: Recommendations for the Management of Subsolid Pulmonary Nodules Detected at CT: A Statement from the Fleischner Society as published in Radiology 2013; 266:304-317.   Electronically Signed   By: Genevive Bi M.D.   On: 12/18/2013 14:51   Dg Chest Port 1 View  12/16/2013   CLINICAL DATA:  Heart palpitations  EXAM: PORTABLE CHEST - 1 VIEW  COMPARISON:  None.  FINDINGS: The heart size and  mediastinal contours are within normal limits. Both lungs are clear. The visualized skeletal structures show scoliosis of the thoracic spine concave to the left.  IMPRESSION: No active disease.   Electronically Signed   By: Alcide Clever M.D.   On: 12/16/2013 16:00

## 2014-01-01 NOTE — Patient Instructions (Addendum)
Your physician recommends that you schedule a follow-up appointment in: 1 month   Your physician has recommended you make the following change in your medication:     Restart HCTZ 25 mg daily  Please follow 1.5 GM sodium diet we have provided for you   Please buy a scale and weigh self daily and bring weight log at next apt     Thank you for choosing [facility] !

## 2014-01-01 NOTE — Assessment & Plan Note (Signed)
She is feeling much better with metoprolol. No further palpitations.

## 2014-01-01 NOTE — Assessment & Plan Note (Addendum)
BP is not well controlled. I have rechecked it in the exam room, manually, and found it to still elevated at 160/99. I will restart the HCTZ 25 mg daily and continue the losartan 100 mg daily. She is to buy a scale and weight daily, recording it on record sheet. She is given a 1500 mg low sodium diet.   I have gone over each medication with her, and let her know which medications are for BP, HR and cholesterol. Will plan on checking BMET on next visit

## 2014-01-17 DIAGNOSIS — I471 Supraventricular tachycardia: Secondary | ICD-10-CM | POA: Diagnosis not present

## 2014-01-17 DIAGNOSIS — E7800 Pure hypercholesterolemia, unspecified: Secondary | ICD-10-CM | POA: Diagnosis not present

## 2014-01-17 DIAGNOSIS — R911 Solitary pulmonary nodule: Secondary | ICD-10-CM | POA: Diagnosis not present

## 2014-01-17 DIAGNOSIS — I10 Essential (primary) hypertension: Secondary | ICD-10-CM | POA: Diagnosis not present

## 2014-01-17 DIAGNOSIS — R002 Palpitations: Secondary | ICD-10-CM | POA: Diagnosis not present

## 2014-01-17 DIAGNOSIS — E039 Hypothyroidism, unspecified: Secondary | ICD-10-CM | POA: Diagnosis not present

## 2014-02-02 DIAGNOSIS — S20219A Contusion of unspecified front wall of thorax, initial encounter: Secondary | ICD-10-CM | POA: Diagnosis not present

## 2014-02-05 ENCOUNTER — Encounter: Payer: Medicare Other | Admitting: Adult Health

## 2014-02-05 NOTE — Progress Notes (Signed)
Error. No Show 

## 2014-02-07 ENCOUNTER — Encounter: Payer: Self-pay | Admitting: Physician Assistant

## 2014-02-07 ENCOUNTER — Ambulatory Visit (INDEPENDENT_AMBULATORY_CARE_PROVIDER_SITE_OTHER): Payer: Medicare Other | Admitting: Physician Assistant

## 2014-02-07 VITALS — BP 160/102 | Ht 60.0 in | Wt 172.0 lb

## 2014-02-07 DIAGNOSIS — I10 Essential (primary) hypertension: Secondary | ICD-10-CM | POA: Diagnosis not present

## 2014-02-07 DIAGNOSIS — I471 Supraventricular tachycardia: Secondary | ICD-10-CM | POA: Diagnosis not present

## 2014-02-07 MED ORDER — DOXAZOSIN MESYLATE 1 MG PO TABS: 1.0000 mg | ORAL_TABLET | Freq: Every day | ORAL | Status: DC

## 2014-02-07 MED ORDER — LOSARTAN POTASSIUM-HCTZ 100-25 MG PO TABS: 1.0000 | ORAL_TABLET | Freq: Every day | ORAL | Status: DC

## 2014-02-07 NOTE — Assessment & Plan Note (Signed)
Blood pressure uncontrolled. She does eat a lot of salt but going out to eat often. I had along discussion with her concerning 2 g sodium diet. We'll combine Cozaar/HCTZ 100/25 mg daily. She can continue her current dose until her pills are gone. We'll check blood work today to make sure her potassium has remained stable on the HCTZ. Will add doxazosin 1 mg daily. Return in one week for blood pressure check. Followup with Dr. Wyline MoodBranch in one month.

## 2014-02-07 NOTE — Assessment & Plan Note (Signed)
No further PSVT on metoprolol

## 2014-02-07 NOTE — Patient Instructions (Signed)
Your physician recommends that you schedule a follow-up appointment in: 1 week with nurse for Blood Pressure check  Your physician recommends that you schedule a follow-up appointment in: 2 weeks with Dr. Wyline MoodBranch  Your physician has recommended you make the following change in your medication:   START LOSARTAN-HCTZ 100-25 MG DAILY AFTER YOU HAVE FINISHED YOUR COZAAR AND HYDRODIURIL  START DOXAZOSIN 1 MG DAILY  Your physician recommends that you return for lab work TODAY BMP  PLEASE READ THE 2 GRAM SODIUM DIET I HAVE GIVEN YOU.  Thank you for choosing [facility]!!

## 2014-02-07 NOTE — Progress Notes (Signed)
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 It was exacerbated by low potassium. Her HCTZ was stopped. She was placed on metoprolol for the SVT. She saw Gladis RiffleKatheryn Lawrence, NP, in followup at which time her blood pressure was high and HCTZ was added back. 2-D echo 12/17/13 showed normal LV function EF 60-65% with grade 1 diastolic dysfunction mild to moderate LVH.  Patient comes in today for followup of blood pressure. She says her blood pressure has been running very high at home about 152/102 despite the HCTZ. She admits to eating out all the time and places such as [employer], [employer], and [employer] caf[employer]. She denies any further palpitations, chest pain, dyspnea, dyspnea on exertion, dizziness or presyncope. Patient would also like to combine the Cozaar/HCTZ as she took in the past.  No Known Allergies   Current Outpatient Prescriptions  Medication Sig Dispense Refill  . CRESTOR 5 MG tablet Take 5 mg by mouth daily.      . furosemide (LASIX) 20 MG tablet Take 1 tablet (20 mg total) by mouth as needed for fluid or edema.  30 tablet  2  . hydrochlorothiazide (HYDRODIURIL) 25 MG tablet Take 1 tablet (25 mg total) by mouth daily.  90 tablet  3  . levothyroxine (SYNTHROID, LEVOTHROID) 75 MCG tablet Take 75 mcg by mouth daily.      Marland Kitchen. losartan (COZAAR) 100 MG tablet Take 1 tablet (100 mg total) by mouth daily.  30 tablet  3  . metoprolol tartrate (LOPRESSOR) 25 MG tablet Take 1 tablet (25 mg total) by mouth 2 (two) times daily.  60 tablet  3   No current facility-administered medications for this visit.    Past Medical History  Diagnosis Date  . Hypertension   . Hypothyroidism     No past surgical history on file.  No family history on file.  History   Social History  . Marital Status: Married    Spouse Name: N/A    Number of Children: N/A  . Years of  Education: N/A   Occupational History  . Not on file.   Social History Main Topics  . Smoking status: Former Smoker -- 10 years    Types: Cigarettes    Quit date: 12/16/1957  . Smokeless tobacco: Never Used  . Alcohol Use: No  . Drug Use: No  . Sexual Activity: Not on file   Other Topics Concern  . Not on file   Social History Narrative  . No narrative on file    ROS: See history of present illness otherwise negative  BP 160/102  Ht 5' (1.524 m)  Wt 172 lb (78.019 kg)  BMI 33.59 kg/m2  PHYSICAL EXAM: Obese, in no acute distress. Neck: No JVD, HJR, Bruit, or thyroid enlargement  Lungs: No tachypnea, clear without wheezing, rales, or rhonchi  Cardiovascular: RRR, PMI not displaced, Normal S1 and S2, no murmurs, gallops, bruit, thrill, or heave.  Abdomen: BS normal. Soft without organomegaly, masses, lesions or tenderness.  Extremities: without cyanosis, clubbing or edema. Good distal pulses bilateral  SKin: Warm, no lesions or rashes   Musculoskeletal: No deformities  Neuro: no focal signs   Wt Readings from Last 3 Encounters:  01/01/14 174 lb (78.926 kg)  12/16/13 160 lb (72.576 kg)    Lab Results  Component Value Date   WBC 4.7 12/17/2013  HGB 12.6 12/17/2013   HCT 38.1 12/17/2013   PLT 212 12/17/2013   GLUCOSE 101* 12/17/2013   ALT 15 12/17/2013   AST 21 12/17/2013   NA 141 12/17/2013   K 3.7 12/17/2013   CL 105 12/17/2013   CREATININE 0.86 12/17/2013   BUN 18 12/17/2013   CO2 24 12/17/2013   TSH 3.130 12/16/2013    EKG: Normal sinus rhythm with LVH nonspecific ST-T wave changes, unchanged from EKG on 12/18/13

## 2014-02-08 LAB — BASIC METABOLIC PANEL WITH GFR
BUN: 17 mg/dL (ref 6–23)
CHLORIDE: 107 meq/L (ref 96–112)
CO2: 26 mEq/L (ref 19–32)
Calcium: 9.4 mg/dL (ref 8.4–10.5)
Creat: 0.92 mg/dL (ref 0.50–1.10)
GFR, Est African American: 71 mL/min
GFR, Est Non African American: 62 mL/min
Glucose, Bld: 85 mg/dL (ref 70–99)
Potassium: 4.1 mEq/L (ref 3.5–5.3)
SODIUM: 140 meq/L (ref 135–145)

## 2014-02-12 ENCOUNTER — Telehealth: Payer: Self-pay | Admitting: *Deleted

## 2014-02-12 NOTE — Telephone Encounter (Signed)
Pt aware of results, forwarded to Dr. Neita CarpSasser

## 2014-02-12 NOTE — Telephone Encounter (Signed)
Message copied by Vernon PreyBARKER, Crawford Tamura T on Mon Feb 12, 2014  4:02 PM ------      Message from: Dyann KiefLENZE, MICHELE M      Created: Mon Feb 12, 2014  7:46 AM       Labs normal ------

## 2014-02-14 ENCOUNTER — Ambulatory Visit (INDEPENDENT_AMBULATORY_CARE_PROVIDER_SITE_OTHER): Payer: Medicare Other | Admitting: *Deleted

## 2014-02-14 VITALS — BP 110/80 | HR 88

## 2014-02-14 DIAGNOSIS — I10 Essential (primary) hypertension: Secondary | ICD-10-CM | POA: Diagnosis not present

## 2014-02-14 NOTE — Patient Instructions (Signed)
Your Blood pressure today was 110/80 and pulse 88. I will forward this information to your provider.   Please keep November 6 th appointment with Dr. Wyline MoodBranch  Thank you for choosing [facility]!!

## 2014-02-15 NOTE — Progress Notes (Signed)
Pt came in for BP check per Herma CarsonMichelle Lenze, PA-C. BP was 110/80. Pt had no complaints and states she feels better. Will forward to Texas InstrumentsMichelle Lenze, PA-C.

## 2014-02-23 ENCOUNTER — Ambulatory Visit (INDEPENDENT_AMBULATORY_CARE_PROVIDER_SITE_OTHER): Payer: Medicare Other | Admitting: Cardiology

## 2014-02-23 ENCOUNTER — Encounter: Payer: Self-pay | Admitting: Cardiology

## 2014-02-23 VITALS — BP 116/80 | HR 74 | Ht 60.0 in | Wt 174.0 lb

## 2014-02-23 DIAGNOSIS — E785 Hyperlipidemia, unspecified: Secondary | ICD-10-CM

## 2014-02-23 DIAGNOSIS — I471 Supraventricular tachycardia: Secondary | ICD-10-CM | POA: Diagnosis not present

## 2014-02-23 DIAGNOSIS — I10 Essential (primary) hypertension: Secondary | ICD-10-CM | POA: Diagnosis not present

## 2014-02-23 NOTE — Patient Instructions (Signed)
Your physician wants you to follow-up in: 1 year You will receive a reminder letter in the mail two months in advance. If you don't receive a letter, please call our office to schedule the follow-up appointment.    Your physician recommends that you continue on your current medications as directed. Please refer to the Current Medication list given to you today.     Thank you for choosing [facility] !  

## 2014-02-23 NOTE — Progress Notes (Signed)
Clinical Summary Amber Rosales is a 73 y.o.female last seen by PA Lenze, this is our first visit together. She is seen for the following medical problems.  1. PSVT - recent admission with noted PSVT, occurred in the setting of hypokalemia.  - from last clinic note has been well controlled on lopressor since that time - denies any palpitations, compliant with lopressor   2. HTN - started on doxazosin last visit with PA Lenze due to continued elevated bp's - seen 02/15/14 for bp check which was 110/80   3. Hyperlipidemia - followed by pcp, no recent panel in our system. Compliant with statin  Past Medical History  Diagnosis Date  . Hypertension   . Hypothyroidism      No Known Allergies   Current Outpatient Prescriptions  Medication Sig Dispense Refill  . CRESTOR 5 MG tablet Take 5 mg by mouth daily.    Marland Kitchen. doxazosin (CARDURA) 1 MG tablet Take 1 tablet (1 mg total) by mouth daily. 30 tablet 3  . furosemide (LASIX) 20 MG tablet Take 1 tablet (20 mg total) by mouth as needed for fluid or edema. 30 tablet 2  . hydrochlorothiazide (HYDRODIURIL) 25 MG tablet Take 1 tablet (25 mg total) by mouth daily. 90 tablet 3  . levothyroxine (SYNTHROID, LEVOTHROID) 75 MCG tablet Take 75 mcg by mouth daily.    Marland Kitchen. losartan (COZAAR) 100 MG tablet Take 1 tablet (100 mg total) by mouth daily. 30 tablet 3  . losartan-hydrochlorothiazide (HYZAAR) 100-25 MG per tablet Take 1 tablet by mouth daily. 30 tablet 3  . metoprolol tartrate (LOPRESSOR) 25 MG tablet Take 1 tablet (25 mg total) by mouth 2 (two) times daily. 60 tablet 3   No current facility-administered medications for this visit.     No past surgical history on file.   No Known Allergies    No family history on file.   Social History Amber Rosales reports that she quit smoking about 56 years ago. Her smoking use included Cigarettes. She smoked 0.00 packs per day for 10 years. She has never used smokeless tobacco. Amber Rosales reports  that she does not drink alcohol.   Review of Systems CONSTITUTIONAL: No weight loss, fever, chills, weakness or fatigue.  HEENT: Eyes: No visual loss, blurred vision, double vision or yellow sclerae.No hearing loss, sneezing, congestion, runny nose or sore throat.  SKIN: No rash or itching.  CARDIOVASCULAR: per HPI RESPIRATORY: No shortness of breath, cough or sputum.  GASTROINTESTINAL: No anorexia, nausea, vomiting or diarrhea. No abdominal pain or blood.  GENITOURINARY: No burning on urination, no polyuria NEUROLOGICAL: No headache, dizziness, syncope, paralysis, ataxia, numbness or tingling in the extremities. No change in bowel or bladder control.  MUSCULOSKELETAL: No muscle, back pain, joint pain or stiffness.  LYMPHATICS: No enlarged nodes. No history of splenectomy.  PSYCHIATRIC: No history of depression or anxiety.  ENDOCRINOLOGIC: No reports of sweating, cold or heat intolerance. No polyuria or polydipsia.  Marland Kitchen.   Physical Examination p 74 bp 116/80 Wt 174 lbs BMI 34 Gen: resting comfortably, no acute distress HEENT: no scleral icterus, pupils equal round and reactive, no palptable cervical adenopathy,  CV: RRR, no m/r/g, no JVD, no carotid bruits Resp: Clear to auscultation bilaterally GI: abdomen is soft, non-tender, non-distended, normal bowel sounds, no hepatosplenomegaly MSK: extremities are warm, no edema.  Skin: warm, no rash Neuro:  no focal deficits Psych: appropriate affect   Diagnostic Studies 11/2013 Echo Study Conclusions  - Left ventricle: The cavity  size was normal. Wall thickness was increased increased in a pattern of mild to moderate LVH. Systolic function was normal. The estimated ejection fraction was in the range of 60% to 65%. Doppler parameters are consistent with abnormal left ventricular relaxation (grade 1 diastolic dysfunction). - Aortic valve: Valve area (VTI): 2.42 cm^2. Valve area (Vmax): 2.54 cm^2. - Right ventricle: The  cavity size was moderately dilated. - Right atrium: The atrium was moderately to severely dilated. - Technically adequate study.    Assessment and Plan  1. PSVT - denies any recent symptoms, well controlled since starting lopressor - continue lopressor  2. HTN - at goal, continue current meds  3. Hyperlipidemia - followed by pcp, will request most recent panel. Continue current statin    F/u 1 year  Antoine PocheJonathan F. Karrine Kluttz, M.D.

## 2014-03-02 ENCOUNTER — Encounter: Payer: Self-pay | Admitting: Cardiology

## 2014-04-16 ENCOUNTER — Other Ambulatory Visit: Payer: Self-pay | Admitting: Internal Medicine

## 2014-04-17 ENCOUNTER — Other Ambulatory Visit: Payer: Self-pay | Admitting: Internal Medicine

## 2014-04-17 DIAGNOSIS — E039 Hypothyroidism, unspecified: Secondary | ICD-10-CM | POA: Diagnosis not present

## 2014-04-17 DIAGNOSIS — K210 Gastro-esophageal reflux disease with esophagitis: Secondary | ICD-10-CM | POA: Diagnosis not present

## 2014-04-17 DIAGNOSIS — E780 Pure hypercholesterolemia: Secondary | ICD-10-CM | POA: Diagnosis not present

## 2014-04-17 DIAGNOSIS — I10 Essential (primary) hypertension: Secondary | ICD-10-CM | POA: Diagnosis not present

## 2014-04-19 ENCOUNTER — Telehealth: Payer: Self-pay | Admitting: *Deleted

## 2014-04-19 ENCOUNTER — Other Ambulatory Visit: Payer: Self-pay | Admitting: Internal Medicine

## 2014-04-19 MED ORDER — METOPROLOL TARTRATE 25 MG PO TABS: 25.0000 mg | ORAL_TABLET | Freq: Two times a day (BID) | ORAL | Status: DC

## 2014-04-19 NOTE — Telephone Encounter (Signed)
Pt needs metoprolol called in to [facility] she is out/tmj

## 2014-04-24 DIAGNOSIS — N183 Chronic kidney disease, stage 3 (moderate): Secondary | ICD-10-CM | POA: Diagnosis not present

## 2014-04-24 DIAGNOSIS — F411 Generalized anxiety disorder: Secondary | ICD-10-CM | POA: Diagnosis not present

## 2014-04-24 DIAGNOSIS — I10 Essential (primary) hypertension: Secondary | ICD-10-CM | POA: Diagnosis not present

## 2014-04-24 DIAGNOSIS — E6609 Other obesity due to excess calories: Secondary | ICD-10-CM | POA: Diagnosis not present

## 2014-04-24 DIAGNOSIS — E039 Hypothyroidism, unspecified: Secondary | ICD-10-CM | POA: Diagnosis not present

## 2014-04-24 DIAGNOSIS — Z1389 Encounter for screening for other disorder: Secondary | ICD-10-CM | POA: Diagnosis not present

## 2014-04-24 DIAGNOSIS — Z23 Encounter for immunization: Secondary | ICD-10-CM | POA: Diagnosis not present

## 2014-04-24 DIAGNOSIS — E780 Pure hypercholesterolemia: Secondary | ICD-10-CM | POA: Diagnosis not present

## 2014-05-03 ENCOUNTER — Other Ambulatory Visit: Payer: Self-pay | Admitting: Physician Assistant

## 2014-06-09 ENCOUNTER — Other Ambulatory Visit: Payer: Self-pay | Admitting: Physician Assistant

## 2014-06-12 ENCOUNTER — Other Ambulatory Visit: Payer: Self-pay | Admitting: Physician Assistant

## 2014-06-17 ENCOUNTER — Emergency Department (HOSPITAL_COMMUNITY): Payer: Medicare Other

## 2014-06-17 ENCOUNTER — Emergency Department (HOSPITAL_COMMUNITY)
Admission: EM | Admit: 2014-06-17 | Discharge: 2014-06-17 | Disposition: A | Discharge status: Home / Self Care | Payer: Medicare Other | Attending: Emergency Medicine | Admitting: Emergency Medicine

## 2014-06-17 ENCOUNTER — Encounter (HOSPITAL_COMMUNITY): Payer: Self-pay | Admitting: Emergency Medicine

## 2014-06-17 DIAGNOSIS — Z043 Encounter for examination and observation following other accident: Secondary | ICD-10-CM | POA: Diagnosis not present

## 2014-06-17 DIAGNOSIS — W1839XA Other fall on same level, initial encounter: Secondary | ICD-10-CM | POA: Diagnosis not present

## 2014-06-17 DIAGNOSIS — Y9389 Activity, other specified: Secondary | ICD-10-CM | POA: Diagnosis not present

## 2014-06-17 DIAGNOSIS — R42 Dizziness and giddiness: Secondary | ICD-10-CM | POA: Insufficient documentation

## 2014-06-17 DIAGNOSIS — Y998 Other external cause status: Secondary | ICD-10-CM | POA: Insufficient documentation

## 2014-06-17 DIAGNOSIS — Y9289 Other specified places as the place of occurrence of the external cause: Secondary | ICD-10-CM | POA: Diagnosis not present

## 2014-06-17 DIAGNOSIS — I10 Essential (primary) hypertension: Secondary | ICD-10-CM | POA: Insufficient documentation

## 2014-06-17 DIAGNOSIS — E039 Hypothyroidism, unspecified: Secondary | ICD-10-CM | POA: Diagnosis not present

## 2014-06-17 DIAGNOSIS — Z79899 Other long term (current) drug therapy: Secondary | ICD-10-CM | POA: Diagnosis not present

## 2014-06-17 DIAGNOSIS — S0990XA Unspecified injury of head, initial encounter: Secondary | ICD-10-CM | POA: Diagnosis not present

## 2014-06-17 DIAGNOSIS — Z87891 Personal history of nicotine dependence: Secondary | ICD-10-CM | POA: Insufficient documentation

## 2014-06-17 DIAGNOSIS — S299XXA Unspecified injury of thorax, initial encounter: Secondary | ICD-10-CM | POA: Diagnosis not present

## 2014-06-17 LAB — CBC WITH DIFFERENTIAL/PLATELET
Basophils Absolute: 0 10*3/uL (ref 0.0–0.1)
Basophils Relative: 0 % (ref 0–1)
Eosinophils Absolute: 0.1 10*3/uL (ref 0.0–0.7)
Eosinophils Relative: 1 % (ref 0–5)
HEMATOCRIT: 40.9 % (ref 36.0–46.0)
HEMOGLOBIN: 13.5 g/dL (ref 12.0–15.0)
LYMPHS ABS: 0.9 10*3/uL (ref 0.7–4.0)
Lymphocytes Relative: 10 % — ABNORMAL LOW (ref 12–46)
MCH: 30.1 pg (ref 26.0–34.0)
MCHC: 33 g/dL (ref 30.0–36.0)
MCV: 91.1 fL (ref 78.0–100.0)
MONO ABS: 0.4 10*3/uL (ref 0.1–1.0)
Monocytes Relative: 5 % (ref 3–12)
Neutro Abs: 7.8 10*3/uL — ABNORMAL HIGH (ref 1.7–7.7)
Neutrophils Relative %: 84 % — ABNORMAL HIGH (ref 43–77)
Platelets: 207 10*3/uL (ref 150–400)
RBC: 4.49 MIL/uL (ref 3.87–5.11)
RDW: 12.9 % (ref 11.5–15.5)
WBC: 9.2 10*3/uL (ref 4.0–10.5)

## 2014-06-17 LAB — COMPREHENSIVE METABOLIC PANEL
ALBUMIN: 3.7 g/dL (ref 3.5–5.2)
ALT: 18 U/L (ref 0–35)
AST: 23 U/L (ref 0–37)
Alkaline Phosphatase: 96 U/L (ref 39–117)
Anion gap: 0 — ABNORMAL LOW (ref 5–15)
BILIRUBIN TOTAL: 0.3 mg/dL (ref 0.3–1.2)
BUN: 22 mg/dL (ref 6–23)
CHLORIDE: 110 mmol/L (ref 96–112)
CO2: 26 mmol/L (ref 19–32)
Calcium: 8.9 mg/dL (ref 8.4–10.5)
Creatinine, Ser: 1.02 mg/dL (ref 0.50–1.10)
GFR calc Af Amer: 62 mL/min — ABNORMAL LOW (ref >90)
GFR calc non Af Amer: 53 mL/min — ABNORMAL LOW (ref >90)
Glucose, Bld: 134 mg/dL — ABNORMAL HIGH (ref 70–99)
Potassium: 3.4 mmol/L — ABNORMAL LOW (ref 3.5–5.1)
SODIUM: 136 mmol/L (ref 135–145)
Total Protein: 6.8 g/dL (ref 6.0–8.3)

## 2014-06-17 LAB — TROPONIN I

## 2014-06-17 MED ORDER — MECLIZINE HCL 12.5 MG PO TABS
25.0000 mg | ORAL_TABLET | Freq: Once | ORAL | Status: AC
  Administered 2014-06-17: 25 mg via ORAL
  Filled 2014-06-17: qty 2

## 2014-06-17 MED ORDER — ONDANSETRON HCL 4 MG/2ML IJ SOLN
4.0000 mg | Freq: Once | INTRAMUSCULAR | Status: AC
  Administered 2014-06-17: 4 mg via INTRAVENOUS
  Filled 2014-06-17: qty 2

## 2014-06-17 MED ORDER — MECLIZINE HCL 25 MG PO TABS: ORAL_TABLET | ORAL | Status: DC

## 2014-06-17 NOTE — ED Notes (Signed)
Pt reports dizziness that started yesterday but has become worse. Pt reports emesis today. Pt states it feels like everything is spinning around her.

## 2014-06-17 NOTE — Discharge Instructions (Signed)
Follow up with your md this week. °

## 2014-06-17 NOTE — ED Provider Notes (Signed)
CSN: [id number]     Arrival date & time 06/17/14  [id number] History  This chart was scribed for Benny LennertJoseph L Cipriana Biller, MD by Ronney LionSuzanne Le, ED Scribe. This patient was seen in room APA09/APA09 and the patient's care was started at 9:49 AM.      Chief Complaint  Patient presents with  . Dizziness  . Fall   Patient is a 74 y.o. female presenting with dizziness and fall. The history is provided by the patient. No language interpreter was used.  Dizziness Quality:  Room spinning Duration:  1 day Timing:  Constant Progression:  Worsening Relieved by:  None tried Worsened by:  Nothing Ineffective treatments:  None tried Associated symptoms: nausea   Associated symptoms: no chest pain, no diarrhea and no headaches   Fall Pertinent negatives include no chest pain, no abdominal pain and no headaches.     HPI Comments: Amber Rosales is a 74 y.o. female who presents to the Emergency Department complaining of room spinning dizziness that has been ongoing since yesterday morning. She complains of associated nausea. She notes a history of heart palpitations. However, she denies a history of MI or any other cardiac conditions. She denies cough, otalgia, or sore throat. PCP: Estanislado PandySASSER,PAUL W, MD   Past Medical History  Diagnosis Date  . Hypertension   . Hypothyroidism    History reviewed. No pertinent past surgical history. Family History  Problem Relation Age of Onset  . Cancer Mother   . Heart failure Sister   . Heart failure Brother    History  Substance Use Topics  . Smoking status: Former Smoker -- 10 years    Types: Cigarettes    Quit date: 12/16/1957  . Smokeless tobacco: Never Used  . Alcohol Use: No   OB History    Gravida Para Term Preterm AB TAB SAB Ectopic Multiple Living   5 3 3  2  2         Review of Systems  Constitutional: Negative for appetite change and fatigue.  HENT: Negative for congestion, ear discharge, ear pain, sinus pressure and sore throat.   Eyes: Negative for  discharge.  Respiratory: Negative for cough.   Cardiovascular: Negative for chest pain.  Gastrointestinal: Positive for nausea. Negative for abdominal pain and diarrhea.  Genitourinary: Negative for frequency and hematuria.  Musculoskeletal: Negative for back pain.  Skin: Negative for rash.  Neurological: Positive for dizziness. Negative for seizures and headaches.  Psychiatric/Behavioral: Negative for hallucinations.      Allergies  Review of patient's allergies indicates no known allergies.  Home Medications   Prior to Admission medications   Medication Sig Start Date End Date Taking? Authorizing Provider  CRESTOR 5 MG tablet Take 5 mg by mouth daily. 11/20/13   Historical Provider, MD  doxazosin (CARDURA) 1 MG tablet TAKE 1 TABLET (1 MG TOTAL) BY MOUTH DAILY. 05/03/14   Antoine PocheJonathan F Branch, MD  furosemide (LASIX) 20 MG tablet Take 1 tablet (20 mg total) by mouth as needed for fluid or edema. 12/18/13   Ripudeep Jenna LuoK Rai, MD  levothyroxine (SYNTHROID, LEVOTHROID) 75 MCG tablet Take 75 mcg by mouth daily. 11/20/13   Historical Provider, MD  losartan-hydrochlorothiazide (HYZAAR) 100-25 MG per tablet TAKE 1 TABLET BY MOUTH DAILY. 06/11/14   Antoine PocheJonathan F Branch, MD  losartan-hydrochlorothiazide (HYZAAR) 100-25 MG per tablet TAKE 1 TABLET BY MOUTH DAILY. 06/12/14   Antoine PocheJonathan F Branch, MD  metoprolol tartrate (LOPRESSOR) 25 MG tablet Take 1 tablet (25 mg total) by mouth 2 (two) times  daily. 04/19/14   Antoine Poche, MD   BP 146/66 mmHg  Pulse 59  Temp(Src) 97.6 F (36.4 C) (Oral)  Resp 16  Ht 5' (1.524 m)  Wt 165 lb (74.844 kg)  BMI 32.22 kg/m2  SpO2 96% Physical Exam  Constitutional: She is oriented to person, place, and time. She appears well-developed.  HENT:  Head: Normocephalic.  Eyes: Conjunctivae and EOM are normal. No scleral icterus.  Neck: Neck supple. No thyromegaly present.  Cardiovascular: Normal rate and regular rhythm.  Exam reveals no gallop and no friction rub.   No  murmur heard. Pulmonary/Chest: No stridor. She has no wheezes. She has no rales. She exhibits no tenderness.  Abdominal: She exhibits no distension. There is no tenderness. There is no rebound.  Musculoskeletal: Normal range of motion. She exhibits no edema.  Lymphadenopathy:    She has no cervical adenopathy.  Neurological: She is oriented to person, place, and time. She exhibits normal muscle tone. Coordination normal.  Skin: No rash noted. No erythema.  Psychiatric: She has a normal mood and affect. Her behavior is normal.  Nursing note and vitals reviewed.   ED Course  Procedures (including critical care time)  DIAGNOSTIC STUDIES: Oxygen Saturation is 96% on room air, normal by my interpretation.    COORDINATION OF CARE: 9:50 AM - Discussed treatment plan with pt at bedside which includes blood tests, XR head, nausea medication, and pt agreed to plan.   Labs Review Labs Reviewed  CBC WITH DIFFERENTIAL/PLATELET - Abnormal; Notable for the following:    Neutrophils Relative % 84 (*)    Neutro Abs 7.8 (*)    Lymphocytes Relative 10 (*)    All other components within normal limits  COMPREHENSIVE METABOLIC PANEL - Abnormal; Notable for the following:    Potassium 3.4 (*)    Glucose, Bld 134 (*)    GFR calc non Af Amer 53 (*)    GFR calc Af Amer 62 (*)    Anion gap 0 (*)    All other components within normal limits  TROPONIN I    Imaging Review Ct Head Wo Contrast  06/17/2014   CLINICAL DATA:  Dizziness, status post fall, no head injury  EXAM: CT HEAD WITHOUT CONTRAST  TECHNIQUE: Contiguous axial images were obtained from the base of the skull through the vertex without intravenous contrast.  COMPARISON:  None.  FINDINGS: No evidence of parenchymal hemorrhage or extra-axial fluid collection. No mass lesion, mass effect, or midline shift.  No CT evidence of acute infarction.  Mild intracranial atherosclerosis.  Mild cortical atrophy.  The visualized paranasal sinuses are  essentially clear. The mastoid air cells are unopacified.  No evidence of calvarial fracture.  IMPRESSION: No evidence of acute intracranial abnormality.  Mild atrophy with intracranial atherosclerosis.   Electronically Signed   By: Charline Bills M.D.   On: 06/17/2014 10:54   Dg Chest Portable 1 View  06/17/2014   CLINICAL DATA:  74 year old female with dizziness and fall earlier today at home in her bathroom  EXAM: PORTABLE CHEST - 1 VIEW  COMPARISON:  Prior chest x-ray 02/02/2014  FINDINGS: The patient is markedly rotated to the left which distorts the cardiac and mediastinal contours. Within these limitations, the cardiac and mediastinal contours appear insignificantly changed compared to prior. Atherosclerotic calcifications present in the transverse aorta. Veiling of the right hemi thorax raises the possibility of a layering pleural effusion. No focal airspace consolidation or pneumothorax. No pulmonary edema. No acute osseous abnormality.  IMPRESSION: 1. The patient is markedly rotated to the left. 2. Possible layering right pleural effusion. Otherwise, no acute cardiopulmonary process. Recommend dedicated PA and lateral chest x-ray when the patient is able.   Electronically Signed   By: Malachy Moan M.D.   On: 06/17/2014 10:20     EKG Interpretation   Date/Time:  Sunday June 17 2014 09:40:50 EST Ventricular Rate:  63 PR Interval:  174 QRS Duration: 103 QT Interval:  427 QTC Calculation: 437 R Axis:   -13 Text Interpretation:  Sinus rhythm RSR' in V1 or V2, right VCD or RVH LVH  with secondary repolarization abnormality Baseline wander in lead(s) V3  Confirmed by Jatavius Ellenwood  MD, Angelee Bahr [phone]) on 06/17/2014 2:30:54 PM      MDM   Final diagnoses:  None    Vertigo,  tx with antivert and follow up with pcp  The chart was scribed for me under my direct supervision.  I personally performed the history, physical, and medical decision making and all procedures in the evaluation of  this patient.Benny Lennert, MD 06/17/14 [phone]

## 2014-06-21 DIAGNOSIS — K600 Acute anal fissure: Secondary | ICD-10-CM | POA: Diagnosis not present

## 2014-10-19 DIAGNOSIS — K210 Gastro-esophageal reflux disease with esophagitis: Secondary | ICD-10-CM | POA: Diagnosis not present

## 2014-10-19 DIAGNOSIS — N183 Chronic kidney disease, stage 3 (moderate): Secondary | ICD-10-CM | POA: Diagnosis not present

## 2014-10-19 DIAGNOSIS — E039 Hypothyroidism, unspecified: Secondary | ICD-10-CM | POA: Diagnosis not present

## 2014-10-19 DIAGNOSIS — I10 Essential (primary) hypertension: Secondary | ICD-10-CM | POA: Diagnosis not present

## 2014-10-19 DIAGNOSIS — E780 Pure hypercholesterolemia: Secondary | ICD-10-CM | POA: Diagnosis not present

## 2014-10-26 DIAGNOSIS — E6609 Other obesity due to excess calories: Secondary | ICD-10-CM | POA: Diagnosis not present

## 2014-10-26 DIAGNOSIS — F411 Generalized anxiety disorder: Secondary | ICD-10-CM | POA: Diagnosis not present

## 2014-10-26 DIAGNOSIS — I10 Essential (primary) hypertension: Secondary | ICD-10-CM | POA: Diagnosis not present

## 2014-10-26 DIAGNOSIS — E039 Hypothyroidism, unspecified: Secondary | ICD-10-CM | POA: Diagnosis not present

## 2014-10-26 DIAGNOSIS — N183 Chronic kidney disease, stage 3 (moderate): Secondary | ICD-10-CM | POA: Diagnosis not present

## 2014-10-26 DIAGNOSIS — E780 Pure hypercholesterolemia: Secondary | ICD-10-CM | POA: Diagnosis not present

## 2015-01-10 DIAGNOSIS — Z23 Encounter for immunization: Secondary | ICD-10-CM | POA: Diagnosis not present

## 2015-02-27 ENCOUNTER — Encounter: Payer: Self-pay | Admitting: Cardiology

## 2015-02-27 ENCOUNTER — Ambulatory Visit (INDEPENDENT_AMBULATORY_CARE_PROVIDER_SITE_OTHER): Payer: Medicare Other | Admitting: Cardiology

## 2015-02-27 VITALS — BP 136/86 | HR 71 | Ht 60.0 in | Wt 183.8 lb

## 2015-02-27 DIAGNOSIS — E785 Hyperlipidemia, unspecified: Secondary | ICD-10-CM | POA: Diagnosis not present

## 2015-02-27 DIAGNOSIS — I10 Essential (primary) hypertension: Secondary | ICD-10-CM | POA: Diagnosis not present

## 2015-02-27 DIAGNOSIS — I471 Supraventricular tachycardia: Secondary | ICD-10-CM | POA: Diagnosis not present

## 2015-02-27 NOTE — Patient Instructions (Signed)
Your physician wants you to follow-up in: 1 year with Dr Branch You will receive a reminder letter in the mail two months in advance. If you don't receive a letter, please call our office to schedule the follow-up appointment.     Your physician recommends that you continue on your current medications as directed. Please refer to the Current Medication list given to you today.     If you need a refill on your cardiac medications before your next appointment, please call your pharmacy.     Thank you for choosing [facility] !        

## 2015-02-27 NOTE — Progress Notes (Signed)
Patient ID: Amber Rosales, female   DOB: 1940-11-11, 74 y.o.   MRN: [id number]     Clinical Summary Amber Rosales is a [age] y.o.female seen today for follow up of the following medical problems.  1. PSVT - denies any palpitations. Symptoms have been well controlled since starting lopressor - compliant with meds  2. HTN - compliant with meds   3. Hyperlipidemia - Compliant with statin. No recent panel in our system. She report pcp recently checked.    Past Medical History  Diagnosis Date  . Hypertension   . Hypothyroidism      No Known Allergies   Current Outpatient Prescriptions  Medication Sig Dispense Refill  . calcium carbonate (TUMS - DOSED IN MG ELEMENTAL CALCIUM) 500 MG chewable tablet Chew 2-3 tablets by mouth daily as needed for indigestion or heartburn.    . CRESTOR 5 MG tablet Take 5 mg by mouth daily.    Marland Kitchen. doxazosin (CARDURA) 1 MG tablet TAKE 1 TABLET (1 MG TOTAL) BY MOUTH DAILY. 30 tablet 11  . furosemide (LASIX) 20 MG tablet Take 1 tablet (20 mg total) by mouth as needed for fluid or edema. 30 tablet 2  . ibuprofen (ADVIL,MOTRIN) 200 MG tablet Take 400 mg by mouth every 6 (six) hours as needed for headache.    . levothyroxine (SYNTHROID, LEVOTHROID) 75 MCG tablet Take 75 mcg by mouth daily.    Marland Kitchen. losartan-hydrochlorothiazide (HYZAAR) 100-25 MG per tablet TAKE 1 TABLET BY MOUTH DAILY. 30 tablet 3  . losartan-hydrochlorothiazide (HYZAAR) 100-25 MG per tablet TAKE 1 TABLET BY MOUTH DAILY. 30 tablet 11  . meclizine (ANTIVERT) 25 MG tablet Take one every 6 hours as needed for dizziness 20 tablet 0  . metoprolol tartrate (LOPRESSOR) 25 MG tablet Take 1 tablet (25 mg total) by mouth 2 (two) times daily. 180 tablet 3  . Ranitidine HCl (ACID REDUCER PO) Take 0.5-1 tablets by mouth daily as needed (acid reflux).     No current facility-administered medications for this visit.     No past surgical history on file.   No Known Allergies    Family History  Problem  Relation Age of Onset  . Cancer Mother   . Heart failure Sister   . Heart failure Brother      Social History Amber Rosales reports that she quit smoking about 57 years ago. Her smoking use included Cigarettes. She quit after 10 years of use. She has never used smokeless tobacco. Amber Rosales reports that she does not drink alcohol.   Review of Systems CONSTITUTIONAL: No weight loss, fever, chills, weakness or fatigue.  HEENT: Eyes: No visual loss, blurred vision, double vision or yellow sclerae.No hearing loss, sneezing, congestion, runny nose or sore throat.  SKIN: No rash or itching.  CARDIOVASCULAR: per HPI RESPIRATORY: No  cough or sputum.  GASTROINTESTINAL: No anorexia, nausea, vomiting or diarrhea. No abdominal pain or blood.  GENITOURINARY: No burning on urination, no polyuria NEUROLOGICAL: No headache, dizziness, syncope, paralysis, ataxia, numbness or tingling in the extremities. No change in bowel or bladder control.  MUSCULOSKELETAL: No muscle, back pain, joint pain or stiffness.  LYMPHATICS: No enlarged nodes. No history of splenectomy.  PSYCHIATRIC: No history of depression or anxiety.  ENDOCRINOLOGIC: No reports of sweating, cold or heat intolerance. No polyuria or polydipsia.  Marland Kitchen.   Physical Examination Filed Vitals:   02/27/15 0922  BP: 136/86  Pulse: 71   Filed Vitals:   02/27/15 0922  Height: 5' (1.524 m)  Weight: 183  lb 12.8 oz (83.371 kg)    Gen: resting comfortably, no acute distress HEENT: no scleral icterus, pupils equal round and reactive, no palptable cervical adenopathy,  CV: RRR, no m/r/g, no jvd Resp: Clear to auscultation bilaterally GI: abdomen is soft, non-tender, non-distended, normal bowel sounds, no hepatosplenomegaly MSK: extremities are warm, no edema.  Skin: warm, no rash Neuro:  no focal deficits Psych: appropriate affect   Diagnostic Studies 11/2013 Echo Study Conclusions  - Left ventricle: The cavity size was normal. Wall  thickness was increased increased in a pattern of mild to moderate LVH. Systolic function was normal. The estimated ejection fraction was in the range of 60% to 65%. Doppler parameters are consistent with abnormal left ventricular relaxation (grade 1 diastolic dysfunction). - Aortic valve: Valve area (VTI): 2.42 cm^2. Valve area (Vmax): 2.54 cm^2. - Right ventricle: The cavity size was moderately dilated. - Right atrium: The atrium was moderately to severely dilated. - Technically adequate study.    Assessment and Plan   1. PSVT - denies any recent symptoms, well controlled since starting lopressor - continue current meds  2. HTN - at goal, continue current meds  3. Hyperlipidemia - will request most recent panel from pcp - continue statin  F/u 1 year.       Antoine Poche, M.D.

## 2015-04-05 ENCOUNTER — Other Ambulatory Visit: Payer: Self-pay

## 2015-04-05 MED ORDER — METOPROLOL TARTRATE 25 MG PO TABS: 25.0000 mg | ORAL_TABLET | Freq: Two times a day (BID) | ORAL | Status: DC

## 2015-04-05 NOTE — Telephone Encounter (Signed)
Refill complete 

## 2015-04-17 DIAGNOSIS — R509 Fever, unspecified: Secondary | ICD-10-CM | POA: Diagnosis not present

## 2015-04-17 DIAGNOSIS — J020 Streptococcal pharyngitis: Secondary | ICD-10-CM | POA: Diagnosis not present

## 2015-04-17 DIAGNOSIS — R05 Cough: Secondary | ICD-10-CM | POA: Diagnosis not present

## 2015-04-25 DIAGNOSIS — R509 Fever, unspecified: Secondary | ICD-10-CM | POA: Diagnosis not present

## 2015-04-25 DIAGNOSIS — I10 Essential (primary) hypertension: Secondary | ICD-10-CM | POA: Diagnosis not present

## 2015-04-25 DIAGNOSIS — R05 Cough: Secondary | ICD-10-CM | POA: Diagnosis not present

## 2015-04-25 DIAGNOSIS — E039 Hypothyroidism, unspecified: Secondary | ICD-10-CM | POA: Diagnosis not present

## 2015-04-25 DIAGNOSIS — E7800 Pure hypercholesterolemia, unspecified: Secondary | ICD-10-CM | POA: Diagnosis not present

## 2015-04-25 DIAGNOSIS — N183 Chronic kidney disease, stage 3 (moderate): Secondary | ICD-10-CM | POA: Diagnosis not present

## 2015-04-25 DIAGNOSIS — J020 Streptococcal pharyngitis: Secondary | ICD-10-CM | POA: Diagnosis not present

## 2015-04-25 DIAGNOSIS — K210 Gastro-esophageal reflux disease with esophagitis: Secondary | ICD-10-CM | POA: Diagnosis not present

## 2015-05-02 DIAGNOSIS — N183 Chronic kidney disease, stage 3 (moderate): Secondary | ICD-10-CM | POA: Diagnosis not present

## 2015-05-02 DIAGNOSIS — R7301 Impaired fasting glucose: Secondary | ICD-10-CM | POA: Diagnosis not present

## 2015-05-02 DIAGNOSIS — I10 Essential (primary) hypertension: Secondary | ICD-10-CM | POA: Diagnosis not present

## 2015-05-02 DIAGNOSIS — E6609 Other obesity due to excess calories: Secondary | ICD-10-CM | POA: Diagnosis not present

## 2015-05-02 DIAGNOSIS — F411 Generalized anxiety disorder: Secondary | ICD-10-CM | POA: Diagnosis not present

## 2015-05-02 DIAGNOSIS — Z13220 Encounter for screening for lipoid disorders: Secondary | ICD-10-CM | POA: Diagnosis not present

## 2015-05-02 DIAGNOSIS — E039 Hypothyroidism, unspecified: Secondary | ICD-10-CM | POA: Diagnosis not present

## 2015-05-13 ENCOUNTER — Other Ambulatory Visit: Payer: Self-pay

## 2015-05-13 MED ORDER — DOXAZOSIN MESYLATE 1 MG PO TABS: ORAL_TABLET | ORAL | Status: DC

## 2015-05-13 NOTE — Telephone Encounter (Signed)
Refilled rx

## 2015-07-10 DIAGNOSIS — J189 Pneumonia, unspecified organism: Secondary | ICD-10-CM | POA: Diagnosis not present

## 2015-07-29 DIAGNOSIS — K5792 Diverticulitis of intestine, part unspecified, without perforation or abscess without bleeding: Secondary | ICD-10-CM | POA: Diagnosis not present

## 2015-07-29 DIAGNOSIS — J189 Pneumonia, unspecified organism: Secondary | ICD-10-CM | POA: Diagnosis not present

## 2015-08-01 DIAGNOSIS — R05 Cough: Secondary | ICD-10-CM | POA: Diagnosis not present

## 2015-10-23 DIAGNOSIS — N183 Chronic kidney disease, stage 3 (moderate): Secondary | ICD-10-CM | POA: Diagnosis not present

## 2015-10-23 DIAGNOSIS — K210 Gastro-esophageal reflux disease with esophagitis: Secondary | ICD-10-CM | POA: Diagnosis not present

## 2015-10-23 DIAGNOSIS — I10 Essential (primary) hypertension: Secondary | ICD-10-CM | POA: Diagnosis not present

## 2015-10-23 DIAGNOSIS — E7800 Pure hypercholesterolemia, unspecified: Secondary | ICD-10-CM | POA: Diagnosis not present

## 2015-10-23 DIAGNOSIS — E039 Hypothyroidism, unspecified: Secondary | ICD-10-CM | POA: Diagnosis not present

## 2015-10-30 DIAGNOSIS — N183 Chronic kidney disease, stage 3 (moderate): Secondary | ICD-10-CM | POA: Diagnosis not present

## 2015-10-30 DIAGNOSIS — E6609 Other obesity due to excess calories: Secondary | ICD-10-CM | POA: Diagnosis not present

## 2015-10-30 DIAGNOSIS — F411 Generalized anxiety disorder: Secondary | ICD-10-CM | POA: Diagnosis not present

## 2015-10-30 DIAGNOSIS — Z13220 Encounter for screening for lipoid disorders: Secondary | ICD-10-CM | POA: Diagnosis not present

## 2015-10-30 DIAGNOSIS — I10 Essential (primary) hypertension: Secondary | ICD-10-CM | POA: Diagnosis not present

## 2015-10-30 DIAGNOSIS — R7301 Impaired fasting glucose: Secondary | ICD-10-CM | POA: Diagnosis not present

## 2015-10-30 DIAGNOSIS — Z6836 Body mass index (BMI) 36.0-36.9, adult: Secondary | ICD-10-CM | POA: Diagnosis not present

## 2015-10-30 DIAGNOSIS — E039 Hypothyroidism, unspecified: Secondary | ICD-10-CM | POA: Diagnosis not present

## 2015-12-10 DIAGNOSIS — H2513 Age-related nuclear cataract, bilateral: Secondary | ICD-10-CM | POA: Diagnosis not present

## 2015-12-20 DIAGNOSIS — L0100 Impetigo, unspecified: Secondary | ICD-10-CM | POA: Diagnosis not present

## 2015-12-20 DIAGNOSIS — Z6836 Body mass index (BMI) 36.0-36.9, adult: Secondary | ICD-10-CM | POA: Diagnosis not present

## 2015-12-20 DIAGNOSIS — B029 Zoster without complications: Secondary | ICD-10-CM | POA: Diagnosis not present

## 2015-12-25 DIAGNOSIS — L0100 Impetigo, unspecified: Secondary | ICD-10-CM | POA: Diagnosis not present

## 2015-12-25 DIAGNOSIS — Z6835 Body mass index (BMI) 35.0-35.9, adult: Secondary | ICD-10-CM | POA: Diagnosis not present

## 2015-12-31 DIAGNOSIS — Z6836 Body mass index (BMI) 36.0-36.9, adult: Secondary | ICD-10-CM | POA: Diagnosis not present

## 2015-12-31 DIAGNOSIS — L0100 Impetigo, unspecified: Secondary | ICD-10-CM | POA: Diagnosis not present

## 2016-01-13 DIAGNOSIS — Z23 Encounter for immunization: Secondary | ICD-10-CM | POA: Diagnosis not present

## 2016-03-24 ENCOUNTER — Encounter: Payer: Self-pay | Admitting: Cardiology

## 2016-03-24 ENCOUNTER — Ambulatory Visit (INDEPENDENT_AMBULATORY_CARE_PROVIDER_SITE_OTHER): Payer: Medicare Other | Admitting: Cardiology

## 2016-03-24 VITALS — BP 126/70 | HR 78 | Ht 60.0 in | Wt 188.0 lb

## 2016-03-24 DIAGNOSIS — E782 Mixed hyperlipidemia: Secondary | ICD-10-CM

## 2016-03-24 DIAGNOSIS — I10 Essential (primary) hypertension: Secondary | ICD-10-CM

## 2016-03-24 DIAGNOSIS — I471 Supraventricular tachycardia: Secondary | ICD-10-CM | POA: Diagnosis not present

## 2016-03-24 NOTE — Patient Instructions (Addendum)
Your physician wants you to follow-up in: 1 year with Dr. Wyline MoodBranch. You will receive a reminder letter in the mail two months in advance. If you don't receive a letter, please call our office to schedule the follow-up appointment.  Your physician has recommended you make the following change in your medication:  Stop taking Cardura  Your physician has requested that you regularly monitor and record your blood pressure readings at home for one week and bring to office. Please use the same machine at the same time of day to check your readings and record them to bring to your follow-up visit.  If you need a refill on your cardiac medications before your next appointment, please call your pharmacy.  Thank you for choosing [facility]!

## 2016-03-24 NOTE — Progress Notes (Signed)
Clinical Summary Amber Rosales is a 75 y.o.female seen today for follow up of the following medical problems.  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  - compliant with meds  2. HTN - compliant with meds - does not check bp at home  3. Hyperlipidemia - Compliant with statin.    Past Medical History:  Diagnosis Date  . Hypertension   . Hypothyroidism      No Known Allergies   Current Outpatient Prescriptions  Medication Sig Dispense Refill  . calcium carbonate (TUMS - DOSED IN MG ELEMENTAL CALCIUM) 500 MG chewable tablet Chew 2-3 tablets by mouth daily as needed for indigestion or heartburn.    . CRESTOR 5 MG tablet Take 5 mg by mouth daily.    Marland Kitchen. doxazosin (CARDURA) 1 MG tablet TAKE 1 TABLET (1 MG TOTAL) BY MOUTH DAILY. 30 tablet 11  . ibuprofen (ADVIL,MOTRIN) 200 MG tablet Take 400 mg by mouth every 6 (six) hours as needed for headache.    . levothyroxine (SYNTHROID, LEVOTHROID) 75 MCG tablet Take 75 mcg by mouth daily.    Marland Kitchen. losartan-hydrochlorothiazide (HYZAAR) 100-25 MG per tablet TAKE 1 TABLET BY MOUTH DAILY. 30 tablet 3  . losartan-hydrochlorothiazide (HYZAAR) 100-25 MG per tablet TAKE 1 TABLET BY MOUTH DAILY. 30 tablet 11  . metoprolol tartrate (LOPRESSOR) 25 MG tablet Take 1 tablet (25 mg total) by mouth 2 (two) times daily. 180 tablet 3  . Ranitidine HCl (ACID REDUCER PO) Take 0.5-1 tablets by mouth daily as needed (acid reflux).     No current facility-administered medications for this visit.      History reviewed. No pertinent surgical history.   No Known Allergies    Family History  Problem Relation Age of Onset  . Cancer Mother   . Heart failure Sister   . Heart failure Brother      Social History Amber Rosales reports that she quit smoking about 58 years ago. Her smoking use included Cigarettes. She quit after 10.00 years of use. She has never used smokeless tobacco. Amber Rosales reports that she does not drink alcohol.   Review of  Systems CONSTITUTIONAL: No weight loss, fever, chills, weakness or fatigue.  HEENT: Eyes: No visual loss, blurred vision, double vision or yellow sclerae.No hearing loss, sneezing, congestion, runny nose or sore throat.  SKIN: No rash or itching.  CARDIOVASCULAR: per HPI RESPIRATORY: No shortness of breath, cough or sputum.  GASTROINTESTINAL: No anorexia, nausea, vomiting or diarrhea. No abdominal pain or blood.  GENITOURINARY: No burning on urination, no polyuria NEUROLOGICAL: No headache, dizziness, syncope, paralysis, ataxia, numbness or tingling in the extremities. No change in bowel or bladder control.  MUSCULOSKELETAL: No muscle, back pain, joint pain or stiffness.  LYMPHATICS: No enlarged nodes. No history of splenectomy.  PSYCHIATRIC: No history of depression or anxiety.  ENDOCRINOLOGIC: No reports of sweating, cold or heat intolerance. No polyuria or polydipsia.  Marland Kitchen.   Physical Examination Vitals:   03/24/16 1055  BP: 126/70  Pulse: 78   Filed Weights   03/24/16 1055  Weight: 188 lb (85.3 kg)    Gen: resting comfortably, no acute distress HEENT: no scleral icterus, pupils equal round and reactive, no palptable cervical adenopathy,  CV: RRR, no m/r/g no jvd Resp: Clear to auscultation bilaterally GI: abdomen is soft, non-tender, non-distended, normal bowel sounds, no hepatosplenomegaly MSK: extremities are warm, no edema.  Skin: warm, no rash Neuro:  no focal deficits Psych: appropriate affect   Diagnostic Studies 11/2013 Echo Study  Conclusions  - Left ventricle: The cavity size was normal. Wall thickness was increased increased in a pattern of mild to moderate LVH. Systolic function was normal. The estimated ejection fraction was in the range of 60% to 65%. Doppler parameters are consistent with abnormal left ventricular relaxation (grade 1 diastolic dysfunction). - Aortic valve: Valve area (VTI): 2.42 cm^2. Valve area (Vmax): 2.54 cm^2. - Right  ventricle: The cavity size was moderately dilated. - Right atrium: The atrium was moderately to severely dilated. - Technically adequate study.    Assessment and Plan  1. PSVT - denies any recent symptoms. We will continue current meds - EKG in clnic shows normal SR.   2. HTN - she is at goal, continue current meds - d/c doxazosin, appears this was started in the past for bp, unclear she needs it.  - submit bp log in 1 week  3. Hyperlipidemia -request most recent panel from pcp - continue current statin  F/u 1 year.       Antoine PocheJonathan F. Arlington Sigmund, M.D., F.A.C.C.

## 2016-04-03 ENCOUNTER — Encounter (HOSPITAL_COMMUNITY): Payer: Self-pay | Admitting: Emergency Medicine

## 2016-04-03 ENCOUNTER — Emergency Department (HOSPITAL_COMMUNITY): Payer: Medicare Other

## 2016-04-03 ENCOUNTER — Emergency Department (HOSPITAL_COMMUNITY)
Admission: EM | Admit: 2016-04-03 | Discharge: 2016-04-03 | Disposition: A | Discharge status: Home / Self Care | Payer: Medicare Other | Attending: Emergency Medicine | Admitting: Emergency Medicine

## 2016-04-03 DIAGNOSIS — Z87891 Personal history of nicotine dependence: Secondary | ICD-10-CM | POA: Insufficient documentation

## 2016-04-03 DIAGNOSIS — E039 Hypothyroidism, unspecified: Secondary | ICD-10-CM | POA: Diagnosis not present

## 2016-04-03 DIAGNOSIS — Z79899 Other long term (current) drug therapy: Secondary | ICD-10-CM | POA: Diagnosis not present

## 2016-04-03 DIAGNOSIS — Z791 Long term (current) use of non-steroidal anti-inflammatories (NSAID): Secondary | ICD-10-CM | POA: Insufficient documentation

## 2016-04-03 DIAGNOSIS — I10 Essential (primary) hypertension: Secondary | ICD-10-CM | POA: Insufficient documentation

## 2016-04-03 DIAGNOSIS — R51 Headache: Secondary | ICD-10-CM | POA: Insufficient documentation

## 2016-04-03 DIAGNOSIS — R519 Headache, unspecified: Secondary | ICD-10-CM

## 2016-04-03 MED ORDER — SODIUM CHLORIDE 0.9 % IV BOLUS (SEPSIS)
500.0000 mL | Freq: Once | INTRAVENOUS | Status: AC
  Administered 2016-04-03: 500 mL via INTRAVENOUS

## 2016-04-03 MED ORDER — DIPHENHYDRAMINE HCL 25 MG PO CAPS
50.0000 mg | ORAL_CAPSULE | Freq: Once | ORAL | Status: AC
Start: 2016-04-03 — End: 2016-04-03
  Administered 2016-04-03: 50 mg via ORAL
  Filled 2016-04-03: qty 2

## 2016-04-03 MED ORDER — METOCLOPRAMIDE HCL 10 MG PO TABS
10.0000 mg | ORAL_TABLET | Freq: Once | ORAL | Status: AC
  Administered 2016-04-03: 10 mg via ORAL
  Filled 2016-04-03: qty 1

## 2016-04-03 NOTE — ED Provider Notes (Addendum)
[facility]-EMERGENCY DEPT Provider Note   CSN: [id number] Arrival date & time: 04/03/16  1522     History   Chief Complaint Chief Complaint  Patient presents with  . Headache    HPI Amber Rosales is a 75 y.o. female presenting with progressive onset of left sided headache over the past week which got worse tonight. She has tried ibuprofen which normally alleviated the pain, but did not help her today. The pain is described as an ache, non-throbbing, non-pulsatile focal to the left post auricular area. She reports dizziness when when lies down in bed sometimes and a prior hx of vertigo, but it is not associated with the headache. Denies any visual disturbances, loss of balance, confusion, weakness, neck pain or stiffness, URI symptoms, N/V, sob, c/p, palpitation, or trauma.  HPI  Past Medical History:  Diagnosis Date  . Hypertension   . Hypothyroidism     Patient Active Problem List   Diagnosis Date Noted  . PSVT (paroxysmal supraventricular tachycardia) (HCC) 12/18/2013  . 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 Heart palpitations 12/16/2013  . HTN (hypertension) 12/16/2013  . Hyperlipidemia 12/16/2013    History reviewed. No pertinent surgical history.  OB History    Gravida Para Term Preterm AB Living   5 3 3   2      SAB TAB Ectopic Multiple Live Births   2               Home Medications    Prior to Admission medications   Medication Sig Start Date End Date Taking? Authorizing Provider  calcium carbonate (TUMS - DOSED IN MG ELEMENTAL CALCIUM) 500 MG chewable tablet Chew 2-3 tablets by mouth daily as needed for indigestion or heartburn.   Yes Historical Provider, MD  CRESTOR 5 MG tablet Take 5 mg by mouth daily. 11/20/13  Yes Historical Provider, MD  ibuprofen (ADVIL,MOTRIN) 200 MG tablet Take 400 mg by mouth every 6 (six) hours as needed for headache.   Yes Historical Provider, MD  levothyroxine (SYNTHROID, LEVOTHROID) 75 MCG tablet Take 75 mcg by mouth daily.  11/20/13  Yes Historical Provider, MD  losartan-hydrochlorothiazide (HYZAAR) 100-25 MG per tablet TAKE 1 TABLET BY MOUTH DAILY. 06/11/14  Yes Antoine Poche, MD  metoprolol tartrate (LOPRESSOR) 25 MG tablet Take 1 tablet (25 mg total) by mouth 2 (two) times daily. 04/05/15  Yes Antoine Poche, MD  ranitidine (ZANTAC) 150 MG tablet Take 150 mg by mouth daily.   Yes Historical Provider, MD    Family History Family History  Problem Relation Age of Onset  . Cancer Mother   . Heart failure Sister   . Heart failure Brother     Social History Social History  Substance Use Topics  . Smoking status: Former Smoker    Years: 10.00    Types: Cigarettes    Quit date: 12/16/1957  . Smokeless tobacco: Never Used  . Alcohol use No     Allergies   Patient has no known allergies.   Review of Systems Review of Systems  Constitutional: Negative for activity change, chills and fever.  HENT: Negative for congestion, ear pain, facial swelling, hearing loss, sore throat, tinnitus and trouble swallowing.   Eyes: Negative for photophobia, pain, redness and visual disturbance.  Respiratory: Negative for cough, chest tightness, shortness of breath, wheezing and stridor.   Cardiovascular: Negative for chest pain, palpitations and leg swelling.  Gastrointestinal: Negative for abdominal distention, abdominal pain, nausea and vomiting.  Genitourinary: Negative for dysuria and hematuria.  Musculoskeletal: Negative for gait problem, neck pain and neck stiffness.  Skin: Negative for color change, pallor, rash and wound.  Neurological: Positive for headaches. Negative for syncope, facial asymmetry, speech difficulty, weakness, light-headedness and numbness.   Psychiatric/Behavioral: Negative for behavioral problems.     Physical Exam Updated Vital Signs BP 139/56 (BP Location: Right Arm)   Pulse 70   Temp 97.1 F (36.2 C) (Oral)   Resp 17   Ht 5' (1.524 m)   Wt 85.3 kg   SpO2 96%   BMI 36.72  kg/m   Physical Exam  Constitutional: She is oriented to person, place, and time. She appears well-developed and well-nourished. No distress.  HENT:  Head: Normocephalic and atraumatic.  Right Ear: External ear normal. Normal pearly grey TM with bony landmarks and cone of light Left Ear: External ear normal.  Normal pearly grey TM with bony landmarks and cone of light Nose: Nose normal.  Mouth/Throat: Oropharynx is clear and moist. No oropharyngeal exudate.  Eyes: Conjunctivae and EOM are normal. Pupils are equal, round, and reactive to light. Right eye exhibits no discharge. Left eye exhibits no discharge. No scleral icterus.  Neck: Normal range of motion. Neck supple.  Cardiovascular: Normal rate and regular rhythm.   Pulmonary/Chest: Effort normal and breath sounds normal. No respiratory distress. She has no wheezes. She has no rales. She exhibits no tenderness.  Abdominal: Soft. She exhibits no distension. There is no tenderness.  Musculoskeletal: Normal range of motion. She exhibits no edema.  Neurological:  - Mental status: Patient is alert and cooperative. Fluent speech and words are clear. Coherent thought processes and insight is good. Patient is oriented x 4 to person, place, time and event. Recent and remote memory intact.  - Cranial nerves:  CN III, IV, VI: pupils equally round, reactive to light, both direct and conscensual. Full extra-ocular movement.  CN VII : muscles of facial expression intact. X : midline uvula.  XII: tongue is midline when protruded. - Motor: No involuntary movements. Muscle tone and bulk normal throughout. Strong and equal grips bilaterally. Muscle strength is 5/5 in elbow flexion and extension, wrist flexion and extension, hip extension, flexion, leg flexion and extension, ankle dorsiflexion and plantar flexion.  - Sensory: Proprioception and light touch intact Normal stance and gait, no ataxia with standard walking to the sink and back to bed. No pronator  drift. Skin: Skin is warm and dry. No rash noted. She is not diaphoretic. No erythema. No pallor.  Psychiatric: She has a normal mood and affect. Her behavior is normal.  Nursing note and vitals reviewed.    ED Treatments / Results  Labs (all labs ordered are listed, but only abnormal results are displayed) Labs Reviewed - No data to display  EKG  EKG Interpretation None       Radiology Ct Head Wo Contrast  Result Date: 04/03/2016 CLINICAL DATA:  Hypertension and headache EXAM: CT HEAD WITHOUT CONTRAST TECHNIQUE: Contiguous axial images were obtained from the base of the skull through the vertex without intravenous contrast. COMPARISON:  Head CT 06/17/2014 FINDINGS: Brain: No mass lesion, intraparenchymal hemorrhage or extra-axial collection. No evidence of acute cortical infarct. Brain parenchyma and CSF-containing spaces are normal for age. Vascular: Mild atherosclerotic calcification of the vertebral and internal carotid arteries at the skullbase. Skull: Normal visualized skull base, calvarium and extracranial soft tissues. Sinuses/Orbits: No sinus fluid levels or advanced mucosal thickening. No mastoid effusion. Normal orbits. IMPRESSION: Normal head CT for age. Electronically Signed  By: Deatra RobinsonKevin  Herman M.D.   On: 04/03/2016 19:26    Procedures Procedures (including critical care time)  Medications Ordered in ED Medications  metoCLOPramide (REGLAN) tablet 10 mg (10 mg Oral Given 04/03/16 1743)  diphenhydrAMINE (BENADRYL) capsule 50 mg (50 mg Oral Given 04/03/16 1743)  sodium chloride 0.9 % bolus 500 mL (0 mLs Intravenous Stopped 04/03/16 2100)     Initial Impression / Assessment and Plan / ED Course  I have reviewed the triage vital signs and the nursing notes.  Pertinent labs & imaging results that were available during my care of the patient were reviewed by me and considered in my medical decision making (see chart for details).  Clinical Course    Reassuring neuro  exam. Patient had a normal gait walking to the sink and back. No cranial deficits, no speech deficits, negative pronator drift, normal equal strength in upper and lower extremities bilaterally. Progressive onset of headache with no other symptoms. Neck supple and non-tender. Patient is afebrile and non-toxic appearing.   Non-contrast CT showed a normal aging brain and no signs of pathology.  Patient was given reglan and benadryl with full resolution of symptoms while in the ED  6:55- patient reassessed and she states that she feels much better. No pain or nausea.  Discharge home with close follow up with PCP and over the counter symptomatic relief. Patient was reassured by negative CT. Patient was offered to take home zofran for nausea PRN, but refused. Advised patient to stay hydrated and drink plenty of water.   Discussed strict return precautions. Patient was advised to return to the emergency department if experiencing any worsening of symptoms, including fever, chills, visual disturbances, weakness or any other concerning symptoms. Patient and husband understood and were ready to go home.  Patient was discussed with Dr. Hyacinth MeekerMiller who also has seen patient and agrees with assessment and plan.  Final Clinical Impressions(s) / ED Diagnoses   Final diagnoses:  Acute nonintractable headache, unspecified headache type    New Prescriptions Discharge Medication List as of 04/03/2016  8:50 PM       Georgiana ShoreJessica B Carlis Blanchard, PA-C 04/04/16 1313    Eber HongBrian Miller, MD 04/04/16 1318    Georgiana ShoreJessica B Anisha Starliper, PA-C 04/04/16 1410    Eber HongBrian Miller, MD 04/05/16 2012

## 2016-04-03 NOTE — ED Provider Notes (Signed)
The patient is a 75 year old female who presents to the hospital with left posterior lateral headache, started gradually over the week, fluctuates in intensity but has been more intense and severe over the last 18 hours. She states that it sometimes gets worse when she puts pressure on that area but at this time the pain is not exacerbated by any palpation nor is it associated with any photophobia, phonophobia or neurologic complaints. There is no fevers chills nausea vomiting or stiff neck and she has no complaints of blurred vision, double vision, ataxia or uncoordinated movements. There is no numbness or weakness of the arms and legs.  On exam the patient has clearing lung sounds, normal heart sounds, no murmur, soft abdomen, normal neurologic exam with normal strength, sensation, clear sensorium, follows commands without difficult.  I observed the patient ambulate around the room and wash her hands without any difficulty. Cranial nerves III through XII are intact  Due to the patient's age and ongoing symptoms for 1 week and lack of history of headaches she will receive a CT scan though subarachnoid is lower on my differential I would consider other etiologies such as an intercranial mass lesion to be higher. Pain medications ordered  Medical screening examination/treatment/procedure(s) were conducted as a shared visit with non-physician practitioner(s) and myself.  I personally evaluated the patient during the encounter.  Clinical Impression:   Final diagnoses:  Acute nonintractable headache, unspecified headache type         Eber HongBrian Shaleen Talamantez, MD 04/04/16 1310

## 2016-04-03 NOTE — ED Triage Notes (Signed)
Pt states Dr Wyline MoodBranch changed her BP meds last week to try to get BP down. On Monday started having pain on left side of head into neck. Today pt is tearful, " pain is worse"  ibuprofen is not helping.

## 2016-04-10 ENCOUNTER — Telehealth: Payer: Self-pay | Admitting: Cardiology

## 2016-04-10 MED ORDER — AMLODIPINE BESYLATE 5 MG PO TABS: 5.0000 mg | ORAL_TABLET | Freq: Every day | ORAL | 3 refills | Status: DC

## 2016-04-10 NOTE — Telephone Encounter (Signed)
[facility]

## 2016-04-10 NOTE — Telephone Encounter (Signed)
Pt has been having headaches since she stopped taking her BP meds, turned in BP sheet yesterday. Please give her a call on her cell @ [phone]

## 2016-04-10 NOTE — Telephone Encounter (Signed)
I spoke with pt,she will start norvasc

## 2016-04-10 NOTE — Telephone Encounter (Signed)
BP log reviewed, mildly elevated but not enough to really cause her symptoms. We had stopped her doxasozin last visit, stopping this medicine does not typically cause headaches, I am not sure the 2 are related. She would do better on a different blood pressure medication, I would start norvasc 5mg  daily   J Quyen Cutsforth MD

## 2016-05-01 DIAGNOSIS — N183 Chronic kidney disease, stage 3 (moderate): Secondary | ICD-10-CM | POA: Diagnosis not present

## 2016-05-01 DIAGNOSIS — I10 Essential (primary) hypertension: Secondary | ICD-10-CM | POA: Diagnosis not present

## 2016-05-01 DIAGNOSIS — E7800 Pure hypercholesterolemia, unspecified: Secondary | ICD-10-CM | POA: Diagnosis not present

## 2016-05-01 DIAGNOSIS — E039 Hypothyroidism, unspecified: Secondary | ICD-10-CM | POA: Diagnosis not present

## 2016-05-05 DIAGNOSIS — N183 Chronic kidney disease, stage 3 (moderate): Secondary | ICD-10-CM | POA: Diagnosis not present

## 2016-05-05 DIAGNOSIS — E6609 Other obesity due to excess calories: Secondary | ICD-10-CM | POA: Diagnosis not present

## 2016-05-05 DIAGNOSIS — I10 Essential (primary) hypertension: Secondary | ICD-10-CM | POA: Diagnosis not present

## 2016-05-05 DIAGNOSIS — R7301 Impaired fasting glucose: Secondary | ICD-10-CM | POA: Diagnosis not present

## 2016-05-05 DIAGNOSIS — H81319 Aural vertigo, unspecified ear: Secondary | ICD-10-CM | POA: Diagnosis not present

## 2016-05-05 DIAGNOSIS — E7800 Pure hypercholesterolemia, unspecified: Secondary | ICD-10-CM | POA: Diagnosis not present

## 2016-05-05 DIAGNOSIS — E039 Hypothyroidism, unspecified: Secondary | ICD-10-CM | POA: Diagnosis not present

## 2016-05-05 DIAGNOSIS — F411 Generalized anxiety disorder: Secondary | ICD-10-CM | POA: Diagnosis not present

## 2016-09-04 DIAGNOSIS — N183 Chronic kidney disease, stage 3 (moderate): Secondary | ICD-10-CM | POA: Diagnosis not present

## 2016-09-04 DIAGNOSIS — E7800 Pure hypercholesterolemia, unspecified: Secondary | ICD-10-CM | POA: Diagnosis not present

## 2016-09-04 DIAGNOSIS — E039 Hypothyroidism, unspecified: Secondary | ICD-10-CM | POA: Diagnosis not present

## 2016-09-04 DIAGNOSIS — K210 Gastro-esophageal reflux disease with esophagitis: Secondary | ICD-10-CM | POA: Diagnosis not present

## 2016-09-04 DIAGNOSIS — R7301 Impaired fasting glucose: Secondary | ICD-10-CM | POA: Diagnosis not present

## 2016-09-04 DIAGNOSIS — I10 Essential (primary) hypertension: Secondary | ICD-10-CM | POA: Diagnosis not present

## 2016-09-15 DIAGNOSIS — Z6836 Body mass index (BMI) 36.0-36.9, adult: Secondary | ICD-10-CM | POA: Diagnosis not present

## 2016-09-15 DIAGNOSIS — N183 Chronic kidney disease, stage 3 (moderate): Secondary | ICD-10-CM | POA: Diagnosis not present

## 2016-09-15 DIAGNOSIS — E039 Hypothyroidism, unspecified: Secondary | ICD-10-CM | POA: Diagnosis not present

## 2016-09-15 DIAGNOSIS — Z0001 Encounter for general adult medical examination with abnormal findings: Secondary | ICD-10-CM | POA: Diagnosis not present

## 2016-09-15 DIAGNOSIS — E7800 Pure hypercholesterolemia, unspecified: Secondary | ICD-10-CM | POA: Diagnosis not present

## 2016-09-15 DIAGNOSIS — R7301 Impaired fasting glucose: Secondary | ICD-10-CM | POA: Diagnosis not present

## 2016-09-15 DIAGNOSIS — H81319 Aural vertigo, unspecified ear: Secondary | ICD-10-CM | POA: Diagnosis not present

## 2016-09-15 DIAGNOSIS — I10 Essential (primary) hypertension: Secondary | ICD-10-CM | POA: Diagnosis not present

## 2016-09-15 DIAGNOSIS — F411 Generalized anxiety disorder: Secondary | ICD-10-CM | POA: Diagnosis not present

## 2016-12-08 DIAGNOSIS — H04123 Dry eye syndrome of bilateral lacrimal glands: Secondary | ICD-10-CM | POA: Diagnosis not present

## 2016-12-08 DIAGNOSIS — H25013 Cortical age-related cataract, bilateral: Secondary | ICD-10-CM | POA: Diagnosis not present

## 2016-12-08 DIAGNOSIS — H2513 Age-related nuclear cataract, bilateral: Secondary | ICD-10-CM | POA: Diagnosis not present

## 2016-12-29 DIAGNOSIS — I10 Essential (primary) hypertension: Secondary | ICD-10-CM | POA: Diagnosis not present

## 2016-12-29 DIAGNOSIS — E7800 Pure hypercholesterolemia, unspecified: Secondary | ICD-10-CM | POA: Diagnosis not present

## 2016-12-29 DIAGNOSIS — E039 Hypothyroidism, unspecified: Secondary | ICD-10-CM | POA: Diagnosis not present

## 2016-12-29 DIAGNOSIS — N183 Chronic kidney disease, stage 3 (moderate): Secondary | ICD-10-CM | POA: Diagnosis not present

## 2016-12-29 DIAGNOSIS — K210 Gastro-esophageal reflux disease with esophagitis: Secondary | ICD-10-CM | POA: Diagnosis not present

## 2017-01-01 DIAGNOSIS — E039 Hypothyroidism, unspecified: Secondary | ICD-10-CM | POA: Diagnosis not present

## 2017-01-01 DIAGNOSIS — R7301 Impaired fasting glucose: Secondary | ICD-10-CM | POA: Diagnosis not present

## 2017-01-01 DIAGNOSIS — I10 Essential (primary) hypertension: Secondary | ICD-10-CM | POA: Diagnosis not present

## 2017-01-01 DIAGNOSIS — N183 Chronic kidney disease, stage 3 (moderate): Secondary | ICD-10-CM | POA: Diagnosis not present

## 2017-01-01 DIAGNOSIS — Z6836 Body mass index (BMI) 36.0-36.9, adult: Secondary | ICD-10-CM | POA: Diagnosis not present

## 2017-01-01 DIAGNOSIS — E7800 Pure hypercholesterolemia, unspecified: Secondary | ICD-10-CM | POA: Diagnosis not present

## 2017-01-01 DIAGNOSIS — Z23 Encounter for immunization: Secondary | ICD-10-CM | POA: Diagnosis not present

## 2017-01-01 DIAGNOSIS — H81319 Aural vertigo, unspecified ear: Secondary | ICD-10-CM | POA: Diagnosis not present

## 2017-01-19 IMAGING — CT CT HEAD W/O CM
3 series · 16 of 47 positions shown, 19 images · non-contrast
Comparison: Head CT 06/17/2014

CLINICAL DATA: Hypertension and headache

EXAM:
CT HEAD WITHOUT CONTRAST
TECHNIQUE: Contiguous axial images were obtained from the base of the skull
through the vertex without intravenous contrast.

[Series 2: head wo · axial · 0.38mm/px · z∈[+1516,+1641]mm · 10 of 30 slices shown, 13 images]
[im 3/30  brain]
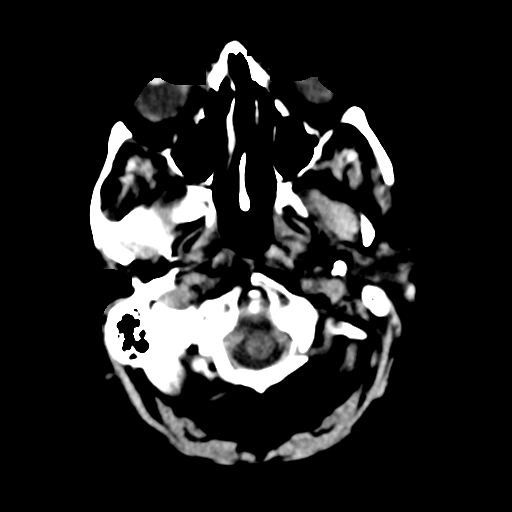
[im 3/30  bone]
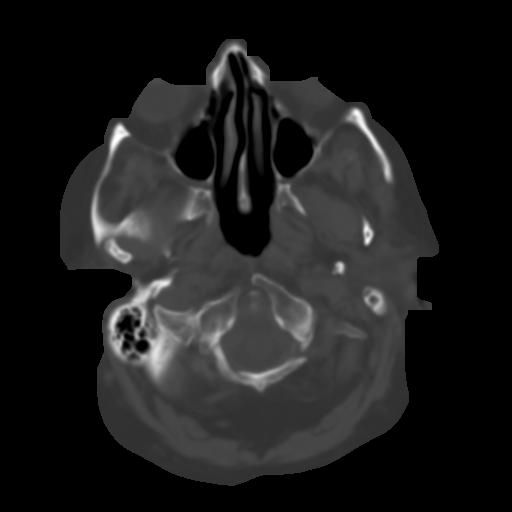
[im 6/30  brain]
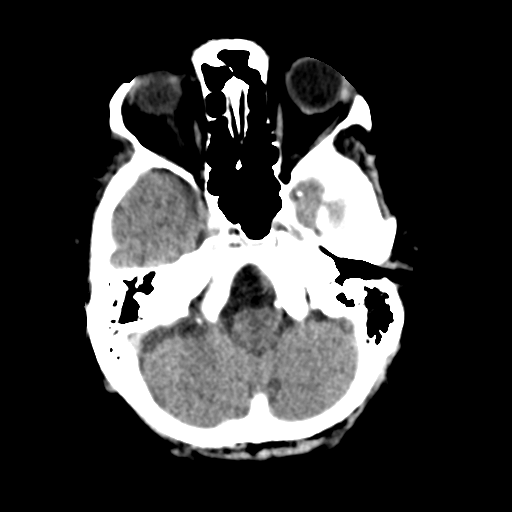
[im 9/30  brain]
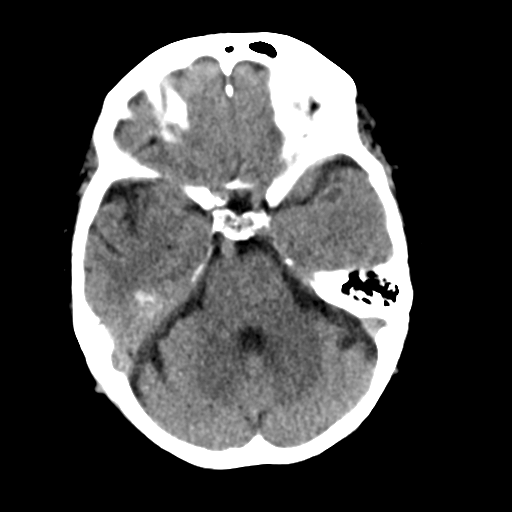
[im 11/30  brain]
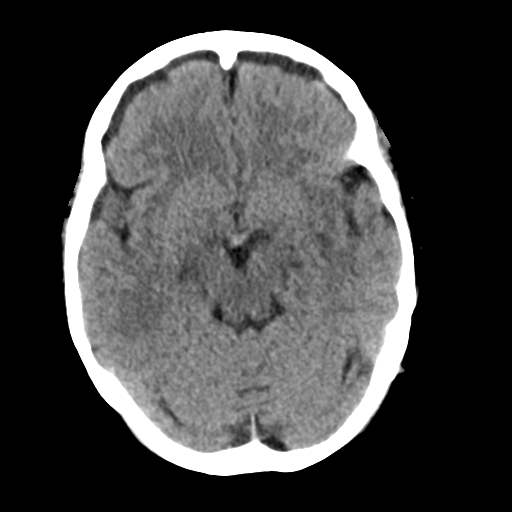
[im 14/30  brain]
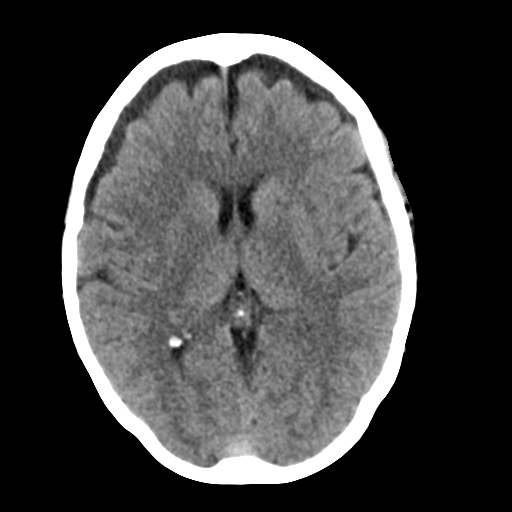
[im 14/30  bone]
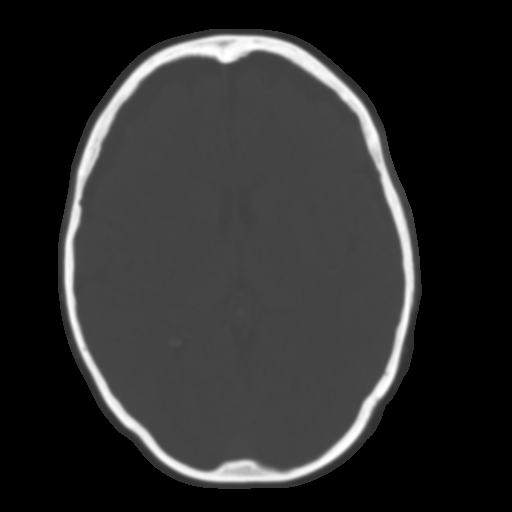
[im 17/30  brain]
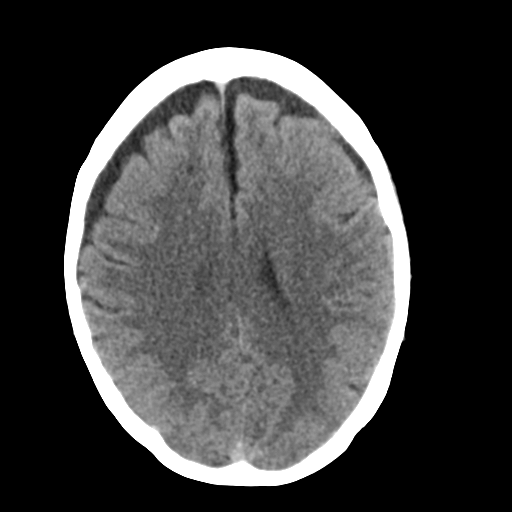
[im 20/30  brain]
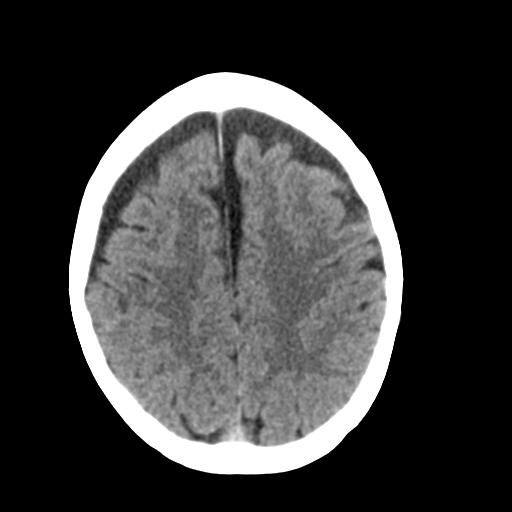
[im 23/30  brain]
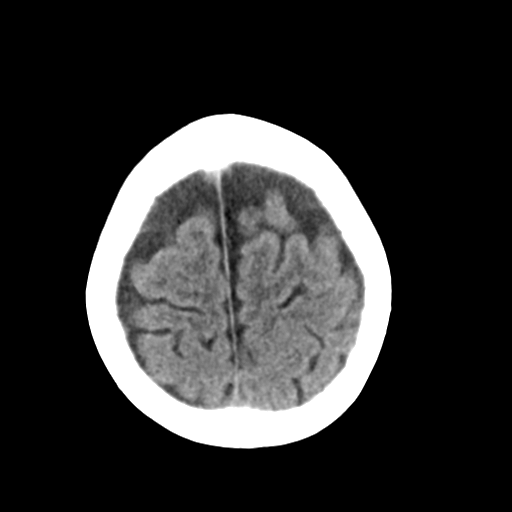
[im 25/30  brain]
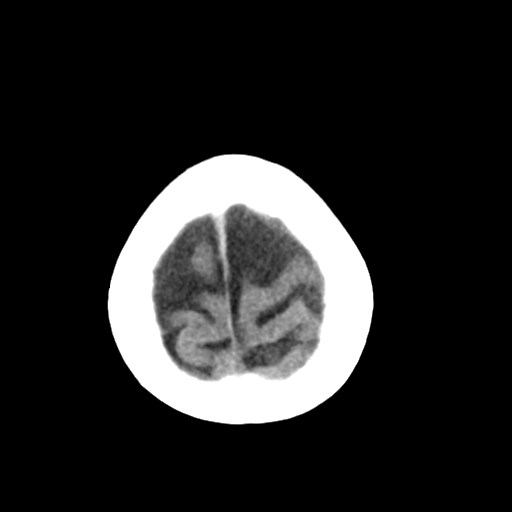
[im 25/30  bone]
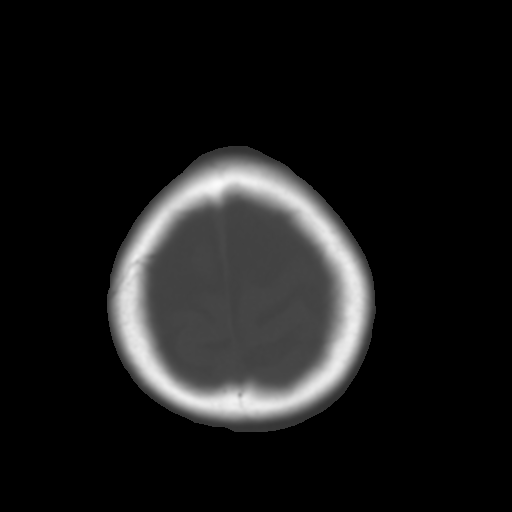
[im 28/30  brain]
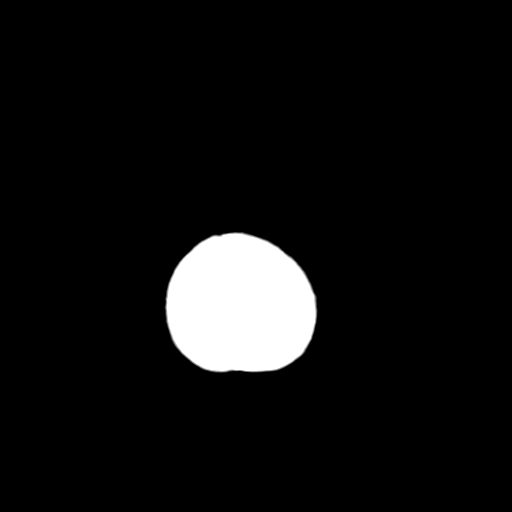

[Series 4: coronal soft tissue · coronal · 0.32mm/px · 3 of 64 slices shown]
[im 22/64  brain]
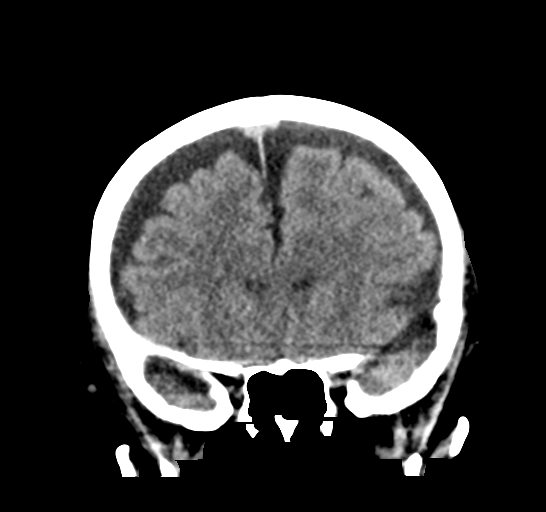
[im 29/64  brain]
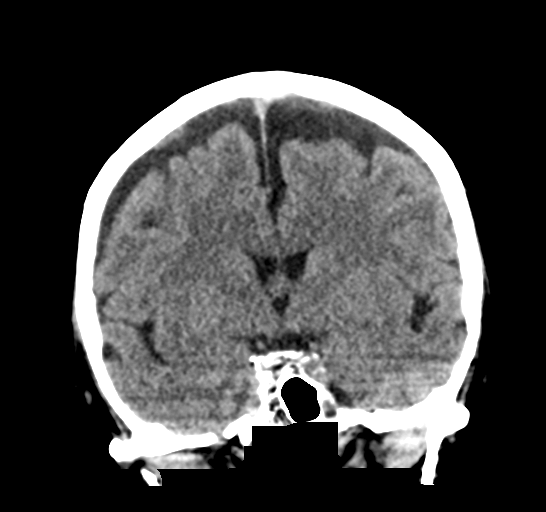
[im 36/64  brain]
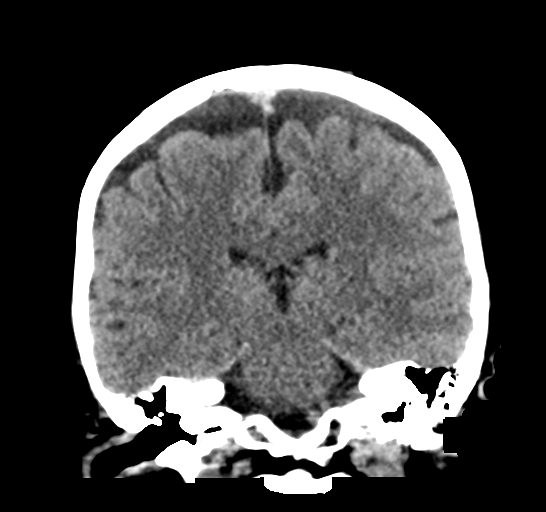

[Series 5: sagittal soft tissue · sagittal · 0.34mm/px · 3 of 67 slices shown]
[im 23/67  brain]
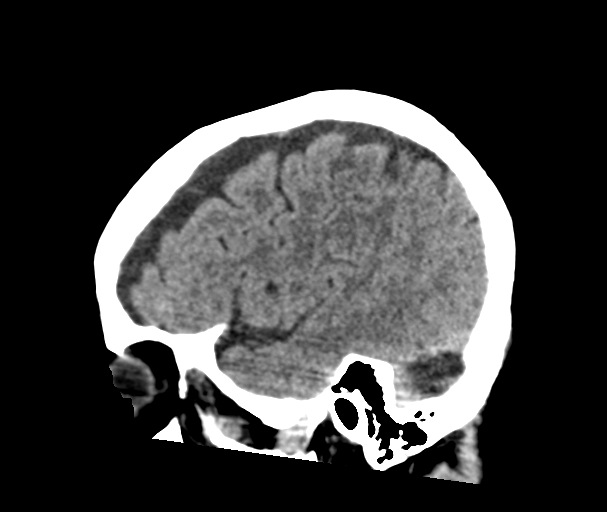
[im 34/67  brain]
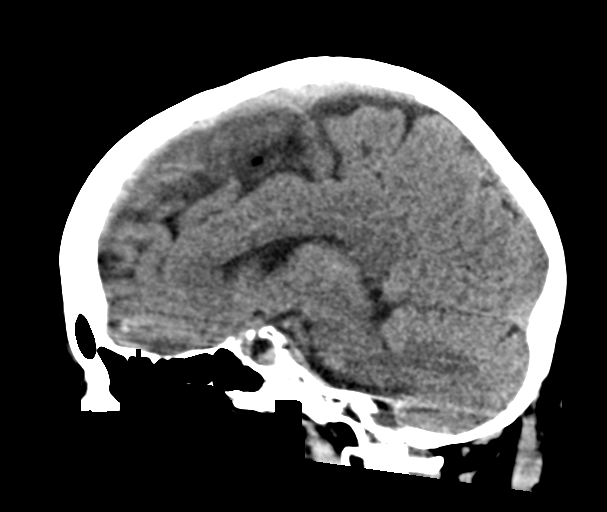
[im 45/67  brain]
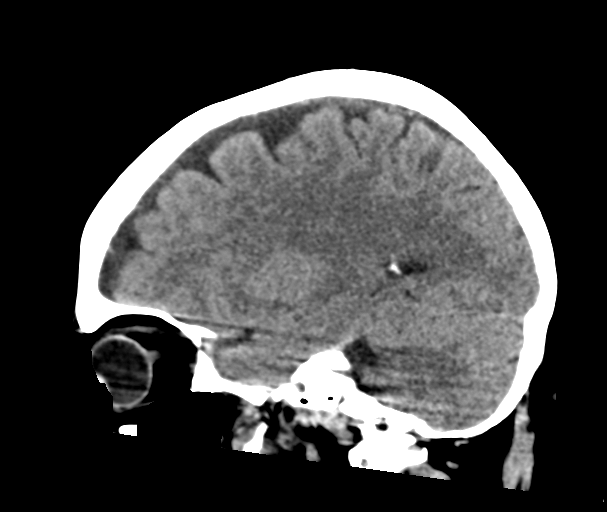

[16 of 47 positions shown; findings below may reference images not displayed]

FINDINGS: Brain: No mass lesion, intraparenchymal hemorrhage or extra-axial
collection. No evidence of acute cortical infarct. Brain parenchyma
and CSF-containing spaces are normal for age.

Vascular: Mild atherosclerotic calcification of the vertebral and
internal carotid arteries at the skullbase.

Skull: Normal visualized skull base, calvarium and extracranial soft
tissues.

Sinuses/Orbits: No sinus fluid levels or advanced mucosal
thickening. No mastoid effusion. Normal orbits.
IMPRESSION: Normal head CT for age.

## 2017-03-24 ENCOUNTER — Encounter: Payer: Self-pay | Admitting: *Deleted

## 2017-03-24 ENCOUNTER — Other Ambulatory Visit: Payer: Self-pay

## 2017-03-24 ENCOUNTER — Encounter: Payer: Self-pay | Admitting: Cardiology

## 2017-03-24 ENCOUNTER — Ambulatory Visit (INDEPENDENT_AMBULATORY_CARE_PROVIDER_SITE_OTHER): Payer: Medicare Other | Admitting: Cardiology

## 2017-03-24 VITALS — BP 137/81 | HR 73 | Ht 60.0 in | Wt 174.0 lb

## 2017-03-24 DIAGNOSIS — I498 Other specified cardiac arrhythmias: Secondary | ICD-10-CM

## 2017-03-24 DIAGNOSIS — E782 Mixed hyperlipidemia: Secondary | ICD-10-CM

## 2017-03-24 DIAGNOSIS — I10 Essential (primary) hypertension: Secondary | ICD-10-CM

## 2017-03-24 NOTE — Patient Instructions (Addendum)
Your physician wants you to follow-up in: 1 [facility]AR [facility] You will receive a reminder letter in the mail two months in advance. If you don't receive a letter, please call our office to schedule the follow-up appointment.  Your physician recommends that you continue on your current medications as directed. Please refer to the Current Medication list given to you today.  Your physician has requested that you regularly monitor and record your blood pressure readings at home FOR 1 WEEK AND CALL OR BRING IN READINGS. Please use the same machine at the same time of day to check your readings and record them to bring to your follow-up visit.   Thank you for choosing [facility]!!

## 2017-03-24 NOTE — Progress Notes (Signed)
Clinical Summary Amber Rosales is a 76 y.o.female seen today for follow up of the following medical problems.  1. PSVT  - denies any palpitations - compliant with meds  2. HTN - she is compliant with bp meds  3. Hyperlipidemia - she is compliant with statin   Past Medical History:  Diagnosis Date  . Hypertension   . Hypothyroidism      No Known Allergies   Current Outpatient Medications  Medication Sig Dispense Refill  . amLODipine (NORVASC) 5 MG tablet Take 1 tablet (5 mg total) by mouth daily. 90 tablet 3  . calcium carbonate (TUMS - DOSED IN MG ELEMENTAL CALCIUM) 500 MG chewable tablet Chew 2-3 tablets by mouth daily as needed for indigestion or heartburn.    . CRESTOR 5 MG tablet Take 5 mg by mouth daily.    Marland Kitchen. ibuprofen (ADVIL,MOTRIN) 200 MG tablet Take 400 mg by mouth every 6 (six) hours as needed for headache.    . levothyroxine (SYNTHROID, LEVOTHROID) 75 MCG tablet Take 75 mcg by mouth daily.    Marland Kitchen. losartan-hydrochlorothiazide (HYZAAR) 100-25 MG per tablet TAKE 1 TABLET BY MOUTH DAILY. 30 tablet 3  . metoprolol tartrate (LOPRESSOR) 25 MG tablet Take 1 tablet (25 mg total) by mouth 2 (two) times daily. 180 tablet 3  . ranitidine (ZANTAC) 150 MG tablet Take 150 mg by mouth daily.     No current facility-administered medications for this visit.      No past surgical history on file.   No Known Allergies    Family History  Problem Relation Age of Onset  . Cancer Mother   . Heart failure Sister   . Heart failure Brother      Social History Amber Rosales reports that she quit smoking about 59 years ago. Her smoking use included cigarettes. She quit after 10.00 years of use. she has never used smokeless tobacco. Amber Rosales reports that she does not drink alcohol.   Review of Systems CONSTITUTIONAL: No weight loss, fever, chills, weakness or fatigue.  HEENT: Eyes: No visual loss, blurred vision, double vision or yellow sclerae.No hearing loss,  sneezing, congestion, runny nose or sore throat.  SKIN: No rash or itching.  CARDIOVASCULAR: per hpi RESPIRATORY: No shortness of breath, cough or sputum.  GASTROINTESTINAL: No anorexia, nausea, vomiting or diarrhea. No abdominal pain or blood.  GENITOURINARY: No burning on urination, no polyuria NEUROLOGICAL: No headache, dizziness, syncope, paralysis, ataxia, numbness or tingling in the extremities. No change in bowel or bladder control.  MUSCULOSKELETAL: No muscle, back pain, joint pain or stiffness.  LYMPHATICS: No enlarged nodes. No history of splenectomy.  PSYCHIATRIC: No history of depression or anxiety.  ENDOCRINOLOGIC: No reports of sweating, cold or heat intolerance. No polyuria or polydipsia.  Marland Kitchen.   Physical Examination Vitals:   03/24/17 1341  BP: 137/81  Pulse: 73  SpO2: 96%   Vitals:   03/24/17 1341  Weight: 174 lb (78.9 kg)  Height: 5' (1.524 m)    Gen: resting comfortably, no acute distress HEENT: no scleral icterus, pupils equal round and reactive, no palptable cervical adenopathy,  CV: RRR, no m/r/g, no jvd Resp: Clear to auscultation bilaterally GI: abdomen is soft, non-tender, non-distended, normal bowel sounds, no hepatosplenomegaly MSK: extremities are warm, no edema.  Skin: warm, no rash Neuro:  no focal deficits Psych: appropriate affect   Diagnostic Studies 11/2013 Echo Study Conclusions  - Left ventricle: The cavity size was normal. Wall thickness was increased increased in a  pattern of mild to moderate LVH. Systolic function was normal. The estimated ejection fraction was in the range of 60% to 65%. Doppler parameters are consistent with abnormal left ventricular relaxation (grade 1 diastolic dysfunction). - Aortic valve: Valve area (VTI): 2.42 cm^2. Valve area (Vmax): 2.54 cm^2. - Right ventricle: The cavity size was moderately dilated. - Right atrium: The atrium was moderately to severely dilated. - Technically adequate  study.    Assessment and Plan  1. PSVT - no recent symptoms, continue to monitor.  EKG in clinic shows NSR  2. HTN - mildly elevated in clinic, goal <130/80 - submit bp log in 1 week, pending results may require med changes  3. Hyperlipidemia -continue statin, request labs from pcp  F/u 1 year.       Antoine PocheJonathan F. Malavika Lira, M.D

## 2017-03-28 ENCOUNTER — Encounter: Payer: Self-pay | Admitting: Cardiology

## 2017-04-02 ENCOUNTER — Other Ambulatory Visit: Payer: Self-pay | Admitting: Cardiology

## 2017-04-30 DIAGNOSIS — E039 Hypothyroidism, unspecified: Secondary | ICD-10-CM | POA: Diagnosis not present

## 2017-04-30 DIAGNOSIS — E1165 Type 2 diabetes mellitus with hyperglycemia: Secondary | ICD-10-CM | POA: Diagnosis not present

## 2017-04-30 DIAGNOSIS — I10 Essential (primary) hypertension: Secondary | ICD-10-CM | POA: Diagnosis not present

## 2017-04-30 DIAGNOSIS — E782 Mixed hyperlipidemia: Secondary | ICD-10-CM | POA: Diagnosis not present

## 2017-05-04 DIAGNOSIS — E039 Hypothyroidism, unspecified: Secondary | ICD-10-CM | POA: Diagnosis not present

## 2017-05-04 DIAGNOSIS — R7301 Impaired fasting glucose: Secondary | ICD-10-CM | POA: Diagnosis not present

## 2017-05-04 DIAGNOSIS — E7800 Pure hypercholesterolemia, unspecified: Secondary | ICD-10-CM | POA: Diagnosis not present

## 2017-05-04 DIAGNOSIS — N183 Chronic kidney disease, stage 3 (moderate): Secondary | ICD-10-CM | POA: Diagnosis not present

## 2017-05-04 DIAGNOSIS — I10 Essential (primary) hypertension: Secondary | ICD-10-CM | POA: Diagnosis not present

## 2017-05-04 DIAGNOSIS — Z6836 Body mass index (BMI) 36.0-36.9, adult: Secondary | ICD-10-CM | POA: Diagnosis not present

## 2017-05-04 DIAGNOSIS — F411 Generalized anxiety disorder: Secondary | ICD-10-CM | POA: Diagnosis not present

## 2017-05-04 DIAGNOSIS — H81319 Aural vertigo, unspecified ear: Secondary | ICD-10-CM | POA: Diagnosis not present

## 2017-09-22 DIAGNOSIS — Z6832 Body mass index (BMI) 32.0-32.9, adult: Secondary | ICD-10-CM | POA: Diagnosis not present

## 2017-09-22 DIAGNOSIS — M545 Low back pain: Secondary | ICD-10-CM | POA: Diagnosis not present

## 2017-10-25 DIAGNOSIS — E1165 Type 2 diabetes mellitus with hyperglycemia: Secondary | ICD-10-CM | POA: Diagnosis not present

## 2017-10-25 DIAGNOSIS — E782 Mixed hyperlipidemia: Secondary | ICD-10-CM | POA: Diagnosis not present

## 2017-10-25 DIAGNOSIS — E039 Hypothyroidism, unspecified: Secondary | ICD-10-CM | POA: Diagnosis not present

## 2017-10-25 DIAGNOSIS — E7800 Pure hypercholesterolemia, unspecified: Secondary | ICD-10-CM | POA: Diagnosis not present

## 2017-11-01 DIAGNOSIS — N183 Chronic kidney disease, stage 3 (moderate): Secondary | ICD-10-CM | POA: Diagnosis not present

## 2017-11-01 DIAGNOSIS — R7301 Impaired fasting glucose: Secondary | ICD-10-CM | POA: Diagnosis not present

## 2017-11-01 DIAGNOSIS — I10 Essential (primary) hypertension: Secondary | ICD-10-CM | POA: Diagnosis not present

## 2017-11-01 DIAGNOSIS — F411 Generalized anxiety disorder: Secondary | ICD-10-CM | POA: Diagnosis not present

## 2017-11-01 DIAGNOSIS — E039 Hypothyroidism, unspecified: Secondary | ICD-10-CM | POA: Diagnosis not present

## 2017-11-01 DIAGNOSIS — Z1331 Encounter for screening for depression: Secondary | ICD-10-CM | POA: Diagnosis not present

## 2017-11-01 DIAGNOSIS — Z1389 Encounter for screening for other disorder: Secondary | ICD-10-CM | POA: Diagnosis not present

## 2017-11-01 DIAGNOSIS — E7800 Pure hypercholesterolemia, unspecified: Secondary | ICD-10-CM | POA: Diagnosis not present

## 2017-11-01 DIAGNOSIS — Z6832 Body mass index (BMI) 32.0-32.9, adult: Secondary | ICD-10-CM | POA: Diagnosis not present

## 2017-12-28 DIAGNOSIS — H04123 Dry eye syndrome of bilateral lacrimal glands: Secondary | ICD-10-CM | POA: Diagnosis not present

## 2017-12-28 DIAGNOSIS — H25013 Cortical age-related cataract, bilateral: Secondary | ICD-10-CM | POA: Diagnosis not present

## 2017-12-28 DIAGNOSIS — H2513 Age-related nuclear cataract, bilateral: Secondary | ICD-10-CM | POA: Diagnosis not present

## 2018-01-17 ENCOUNTER — Other Ambulatory Visit: Payer: Self-pay | Admitting: Cardiology

## 2018-03-08 ENCOUNTER — Other Ambulatory Visit: Payer: Self-pay

## 2018-04-01 ENCOUNTER — Ambulatory Visit (INDEPENDENT_AMBULATORY_CARE_PROVIDER_SITE_OTHER): Payer: Medicare Other | Admitting: Cardiology

## 2018-04-01 ENCOUNTER — Encounter: Payer: Self-pay | Admitting: Cardiology

## 2018-04-01 VITALS — BP 150/88 | HR 74 | Ht 60.0 in | Wt 173.0 lb

## 2018-04-01 DIAGNOSIS — I10 Essential (primary) hypertension: Secondary | ICD-10-CM

## 2018-04-01 NOTE — Patient Instructions (Signed)

## 2018-04-01 NOTE — Progress Notes (Signed)
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 HTN - compliant with bp meds. DOes not monitor bp at home.   3. Hyperlipidemia - most recent labs with pcp  Past Medical History:  Diagnosis Date  . Hypertension   . Hypothyroidism      No Known Allergies   Current Outpatient Medications  Medication Sig Dispense Refill  . amLODipine (NORVASC) 5 MG tablet Take 5 mg by mouth daily.    Marland Kitchen. amLODipine (NORVASC) 5 MG tablet TAKE 1 TABLET (5 MG TOTAL) BY MOUTH DAILY. 90 tablet 0  . calcium carbonate (TUMS - DOSED IN MG ELEMENTAL CALCIUM) 500 MG chewable tablet Chew 2-3 tablets by mouth daily as needed for indigestion or heartburn.    . CRESTOR 5 MG tablet Take 5 mg by mouth daily.    Marland Kitchen. ibuprofen (ADVIL,MOTRIN) 200 MG tablet Take 400 mg by mouth every 6 (six) hours as needed for headache.    . levothyroxine (SYNTHROID, LEVOTHROID) 75 MCG tablet Take 75 mcg by mouth daily.    Marland Kitchen. losartan-hydrochlorothiazide (HYZAAR) 100-25 MG per tablet TAKE 1 TABLET BY MOUTH DAILY. 30 tablet 3  . metoprolol tartrate (LOPRESSOR) 25 MG tablet Take 1 tablet (25 mg total) by mouth 2 (two) times daily. 180 tablet 3  . ranitidine (ZANTAC) 150 MG tablet Take 150 mg by mouth daily.     No current facility-administered medications for this visit.      No past surgical history on file.   No Known Allergies    Family History  Problem Relation Age of Onset  . Cancer Mother   . Heart failure Sister   . Heart failure Brother      Social History Amber Rosales reports that she quit smoking about 60 years ago. Her smoking use included cigarettes. She quit after 10.00 years of use. She has never used smokeless tobacco. Amber Rosales reports no history of alcohol use.   Review of Systems CONSTITUTIONAL: No weight loss, fever, chills, weakness or fatigue.  HEENT: Eyes: No visual loss, blurred vision, double vision or yellow sclerae.No  hearing loss, sneezing, congestion, runny nose or sore throat.  SKIN: No rash or itching.  CARDIOVASCULAR: per hpi RESPIRATORY: No shortness of breath, cough or sputum.  GASTROINTESTINAL: No anorexia, nausea, vomiting or diarrhea. No abdominal pain or blood.  GENITOURINARY: No burning on urination, no polyuria NEUROLOGICAL: No headache, dizziness, syncope, paralysis, ataxia, numbness or tingling in the extremities. No change in bowel or bladder control.  MUSCULOSKELETAL: No muscle, back pain, joint pain or stiffness.  LYMPHATICS: No enlarged nodes. No history of splenectomy.  PSYCHIATRIC: No history of depression or anxiety.  ENDOCRINOLOGIC: No reports of sweating, cold or heat intolerance. No polyuria or polydipsia.  Marland Kitchen.   Physical Examination Vitals:   04/01/18 1109  BP: (!) 150/88  Pulse: 74  SpO2: 96%   Vitals:   04/01/18 1109  Weight: 173 lb (78.5 kg)  Height: 5' (1.524 m)    Gen: resting comfortably, no acute distress HEENT: no scleral icterus, pupils equal round and reactive, no palptable cervical adenopathy,  CV: RRR, no m/r/g, no jvd Resp: Clear to auscultation bilaterally GI: abdomen is soft, non-tender, non-distended, normal bowel sounds, no hepatosplenomegaly MSK: extremities are warm, no edema.  Skin: warm, no rash Neuro:  no focal deficits Psych: appropriate affect   Diagnostic Studies 11/2013 Echo Study Conclusions  - Left ventricle: The cavity size  was normal. Wall thickness was increased increased in a pattern of mild to moderate LVH. Systolic function was normal. The estimated ejection fraction was in the range of 60% to 65%. Doppler parameters are consistent with abnormal left ventricular relaxation (grade 1 diastolic dysfunction). - Aortic valve: Valve area (VTI): 2.42 cm^2. Valve area (Vmax): 2.54 cm^2. - Right ventricle: The cavity size was moderately dilated. - Right atrium: The atrium was moderately to severely dilated. -  Technically adequate study.     Assessment and Plan  1. PSVT - doing well without symptoms, contiue current meds - EKG today shows NSR   2. HTN -repeat manual check 128/78 at goal, continue current meds  3. Hyperlipidemia -request labs from pcp, continue statin  F/u 1 year.      Antoine Poche, M.D.,

## 2018-04-29 DIAGNOSIS — I10 Essential (primary) hypertension: Secondary | ICD-10-CM | POA: Diagnosis not present

## 2018-04-29 DIAGNOSIS — R7301 Impaired fasting glucose: Secondary | ICD-10-CM | POA: Diagnosis not present

## 2018-04-29 DIAGNOSIS — E7800 Pure hypercholesterolemia, unspecified: Secondary | ICD-10-CM | POA: Diagnosis not present

## 2018-04-29 DIAGNOSIS — N183 Chronic kidney disease, stage 3 (moderate): Secondary | ICD-10-CM | POA: Diagnosis not present

## 2018-04-29 DIAGNOSIS — E039 Hypothyroidism, unspecified: Secondary | ICD-10-CM | POA: Diagnosis not present

## 2018-04-29 DIAGNOSIS — K210 Gastro-esophageal reflux disease with esophagitis: Secondary | ICD-10-CM | POA: Diagnosis not present

## 2018-04-29 DIAGNOSIS — E782 Mixed hyperlipidemia: Secondary | ICD-10-CM | POA: Diagnosis not present

## 2018-05-03 DIAGNOSIS — E039 Hypothyroidism, unspecified: Secondary | ICD-10-CM | POA: Diagnosis not present

## 2018-05-03 DIAGNOSIS — Z23 Encounter for immunization: Secondary | ICD-10-CM | POA: Diagnosis not present

## 2018-05-03 DIAGNOSIS — I10 Essential (primary) hypertension: Secondary | ICD-10-CM | POA: Diagnosis not present

## 2018-05-03 DIAGNOSIS — N183 Chronic kidney disease, stage 3 (moderate): Secondary | ICD-10-CM | POA: Diagnosis not present

## 2018-05-03 DIAGNOSIS — Z6834 Body mass index (BMI) 34.0-34.9, adult: Secondary | ICD-10-CM | POA: Diagnosis not present

## 2018-05-03 DIAGNOSIS — R7301 Impaired fasting glucose: Secondary | ICD-10-CM | POA: Diagnosis not present

## 2018-05-03 DIAGNOSIS — H81319 Aural vertigo, unspecified ear: Secondary | ICD-10-CM | POA: Diagnosis not present

## 2018-05-03 DIAGNOSIS — E7800 Pure hypercholesterolemia, unspecified: Secondary | ICD-10-CM | POA: Diagnosis not present

## 2018-08-12 ENCOUNTER — Other Ambulatory Visit: Payer: Self-pay | Admitting: Cardiology

## 2018-10-17 DIAGNOSIS — E7800 Pure hypercholesterolemia, unspecified: Secondary | ICD-10-CM | POA: Diagnosis not present

## 2018-10-17 DIAGNOSIS — E039 Hypothyroidism, unspecified: Secondary | ICD-10-CM | POA: Diagnosis not present

## 2018-10-17 DIAGNOSIS — E1165 Type 2 diabetes mellitus with hyperglycemia: Secondary | ICD-10-CM | POA: Diagnosis not present

## 2018-10-17 DIAGNOSIS — K210 Gastro-esophageal reflux disease with esophagitis: Secondary | ICD-10-CM | POA: Diagnosis not present

## 2018-10-17 DIAGNOSIS — I10 Essential (primary) hypertension: Secondary | ICD-10-CM | POA: Diagnosis not present

## 2018-10-17 DIAGNOSIS — R5383 Other fatigue: Secondary | ICD-10-CM | POA: Diagnosis not present

## 2018-11-03 DIAGNOSIS — I10 Essential (primary) hypertension: Secondary | ICD-10-CM | POA: Diagnosis not present

## 2018-11-03 DIAGNOSIS — Z6835 Body mass index (BMI) 35.0-35.9, adult: Secondary | ICD-10-CM | POA: Diagnosis not present

## 2018-11-03 DIAGNOSIS — Z0001 Encounter for general adult medical examination with abnormal findings: Secondary | ICD-10-CM | POA: Diagnosis not present

## 2018-11-03 DIAGNOSIS — H81319 Aural vertigo, unspecified ear: Secondary | ICD-10-CM | POA: Diagnosis not present

## 2018-11-03 DIAGNOSIS — E7800 Pure hypercholesterolemia, unspecified: Secondary | ICD-10-CM | POA: Diagnosis not present

## 2018-11-03 DIAGNOSIS — Z1212 Encounter for screening for malignant neoplasm of rectum: Secondary | ICD-10-CM | POA: Diagnosis not present

## 2018-11-03 DIAGNOSIS — E039 Hypothyroidism, unspecified: Secondary | ICD-10-CM | POA: Diagnosis not present

## 2018-11-03 DIAGNOSIS — N183 Chronic kidney disease, stage 3 (moderate): Secondary | ICD-10-CM | POA: Diagnosis not present

## 2019-01-02 DIAGNOSIS — Z23 Encounter for immunization: Secondary | ICD-10-CM | POA: Diagnosis not present

## 2019-01-17 ENCOUNTER — Other Ambulatory Visit: Payer: Self-pay

## 2019-01-17 DIAGNOSIS — Z20822 Contact with and (suspected) exposure to covid-19: Secondary | ICD-10-CM

## 2019-01-18 LAB — NOVEL CORONAVIRUS, NAA: SARS-CoV-2, NAA: NOT DETECTED

## 2019-02-03 DIAGNOSIS — H52203 Unspecified astigmatism, bilateral: Secondary | ICD-10-CM | POA: Diagnosis not present

## 2019-02-03 DIAGNOSIS — H2513 Age-related nuclear cataract, bilateral: Secondary | ICD-10-CM | POA: Diagnosis not present

## 2019-02-03 DIAGNOSIS — H5202 Hypermetropia, left eye: Secondary | ICD-10-CM | POA: Diagnosis not present

## 2019-02-03 DIAGNOSIS — H25013 Cortical age-related cataract, bilateral: Secondary | ICD-10-CM | POA: Diagnosis not present

## 2019-02-03 DIAGNOSIS — H5211 Myopia, right eye: Secondary | ICD-10-CM | POA: Diagnosis not present

## 2019-02-03 DIAGNOSIS — H524 Presbyopia: Secondary | ICD-10-CM | POA: Diagnosis not present

## 2019-03-21 ENCOUNTER — Telehealth: Payer: Self-pay | Admitting: Cardiology

## 2019-03-21 NOTE — Telephone Encounter (Signed)

## 2019-03-22 ENCOUNTER — Encounter: Payer: Self-pay | Admitting: Cardiology

## 2019-03-22 ENCOUNTER — Telehealth (INDEPENDENT_AMBULATORY_CARE_PROVIDER_SITE_OTHER): Payer: Medicare Other | Admitting: Cardiology

## 2019-03-22 VITALS — HR 74 | Ht 60.0 in | Wt 180.0 lb

## 2019-03-22 DIAGNOSIS — I471 Supraventricular tachycardia: Secondary | ICD-10-CM

## 2019-03-22 DIAGNOSIS — I10 Essential (primary) hypertension: Secondary | ICD-10-CM | POA: Diagnosis not present

## 2019-03-22 DIAGNOSIS — E782 Mixed hyperlipidemia: Secondary | ICD-10-CM

## 2019-03-22 NOTE — Progress Notes (Signed)
Virtual Visit via Telephone Note   This visit type was conducted due to national recommendations for restrictions regarding the COVID-19 Pandemic (e.g. social distancing) in an effort to limit this patient's exposure and mitigate transmission in our community.  Due to her co-morbid illnesses, this patient is at least at moderate risk for complications without adequate follow up.  This format is felt to be most appropriate for this patient at this time.  The patient did not have access to video technology/had technical difficulties with video requiring transitioning to audio format only (telephone).  All issues noted in this document were discussed and addressed.  No physical exam could be performed with this format.  Please refer to the patient's chart for her  consent to telehealth for [facility].   Date:  03/22/2019   ID:  Amber Rosales, DOB [birth year], MRN [id number]  Patient Location: Home Provider Location: Home  PCP:  Estanislado Pandy, MD  Cardiologist:  Dina Rich, MD  Electrophysiologist:  None   Evaluation Performed:  Follow-Up Visit  Chief Complaint:  Follow up visit  History of Present Illness:    Amber Rosales is a 78 y.o. 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 HTN - remains compliant with meds   3. Hyperlipidemia - labs followed by pcp   The patient does not have symptoms concerning for COVID-19 infection (fever, chills, cough, or new shortness of breath).    Past Medical History:  Diagnosis Date  . Hypertension   . Hypothyroidism    Past Surgical History:  Procedure Laterality Date  . DILATION AND CURETTAGE OF UTERUS    . WISDOM TOOTH EXTRACTION       No outpatient medications have been marked as taking for the 03/22/19 encounter (Appointment) with Antoine Poche, MD.     Allergies:   Patient has no known allergies.   Social History    Tobacco Use  . Smoking status: Former Smoker    Years: 10.00    Types: Cigarettes    Quit date: 12/16/1957    Years since quitting: 61.3  . Smokeless tobacco: Never Used  Substance Use Topics  . Alcohol use: No  . Drug use: No     Family Hx: The patient's family history includes Cancer in her mother; Heart failure in her brother and sister.  ROS:   Please see the history of present illness.     All other systems reviewed and are negative.   Prior CV studies:   The following studies were reviewed today:  11/2013 Echo Study Conclusions  - Left ventricle: The cavity size was normal. Wall thickness was increased increased in a pattern of mild to moderate LVH. Systolic function was normal. The estimated ejection fraction was in the range of 60% to 65%. Doppler parameters are consistent with abnormal left ventricular relaxation (grade 1 diastolic dysfunction). - Aortic valve: Valve area (VTI): 2.42 cm^2. Valve area (Vmax): 2.54 cm^2. - Right ventricle: The cavity size was moderately dilated. - Right atrium: The atrium was moderately to severely dilated. - Technically adequate study.  Labs/Other Tests and Data Reviewed:    EKG:  No ECG reviewed.  Recent Labs: No results found for requested labs within last 8760 hours.   Recent Lipid Panel No results found for: CHOL, TRIG, HDL, CHOLHDL, LDLCALC, LDLDIRECT  Wt Readings from Last 3 Encounters:  04/01/18 173 lb (78.5 kg)  03/24/17 174 lb (78.9 kg)  04/03/16 188 lb (85.3 kg)     Objective:    Vital Signs:   Today's Vitals   03/22/19 0905  Pulse: 74  Weight: 180 lb (81.6 kg)  Height: 5' (1.524 m)   Body mass index is 35.15 kg/m. Normal affect. Normal speech pattern and tone. Comfortable, no apparent distress. No audible signs of SOB or wheezing.   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 HTN - continue current meds  3. Hyperlipidemia -continue statin,  request labs from pcp  F/u 1 year.    COVID-19 Education: The signs and symptoms of COVID-19 were discussed with the patient and how to seek care for testing (follow up with PCP or arrange E-visit).  The importance of social distancing was discussed today.  Time:   Today, I have spent 14 minutes with the patient with telehealth technology discussing the above problems.     Medication Adjustments/Labs and Tests Ordered: Current medicines are reviewed at length with the patient today.  Concerns regarding medicines are outlined above.   Tests Ordered: No orders of the defined types were placed in this encounter.   Medication Changes: No orders of the defined types were placed in this encounter.   Follow Up:  In Person in 1 year(s)  Signed, Carlyle Dolly, MD  03/22/2019 8:37 AM    [place]

## 2019-03-22 NOTE — Patient Instructions (Signed)

## 2019-05-02 DIAGNOSIS — Z23 Encounter for immunization: Secondary | ICD-10-CM | POA: Diagnosis not present

## 2019-05-05 DIAGNOSIS — E7800 Pure hypercholesterolemia, unspecified: Secondary | ICD-10-CM | POA: Diagnosis not present

## 2019-05-05 DIAGNOSIS — I10 Essential (primary) hypertension: Secondary | ICD-10-CM | POA: Diagnosis not present

## 2019-05-05 DIAGNOSIS — R5383 Other fatigue: Secondary | ICD-10-CM | POA: Diagnosis not present

## 2019-05-05 DIAGNOSIS — H81319 Aural vertigo, unspecified ear: Secondary | ICD-10-CM | POA: Diagnosis not present

## 2019-05-05 DIAGNOSIS — E039 Hypothyroidism, unspecified: Secondary | ICD-10-CM | POA: Diagnosis not present

## 2019-05-05 DIAGNOSIS — R7301 Impaired fasting glucose: Secondary | ICD-10-CM | POA: Diagnosis not present

## 2019-05-05 DIAGNOSIS — Z6836 Body mass index (BMI) 36.0-36.9, adult: Secondary | ICD-10-CM | POA: Diagnosis not present

## 2019-06-12 DIAGNOSIS — Z23 Encounter for immunization: Secondary | ICD-10-CM | POA: Diagnosis not present

## 2019-06-16 DIAGNOSIS — R5383 Other fatigue: Secondary | ICD-10-CM | POA: Diagnosis not present

## 2019-06-16 DIAGNOSIS — E039 Hypothyroidism, unspecified: Secondary | ICD-10-CM | POA: Diagnosis not present

## 2019-08-03 ENCOUNTER — Other Ambulatory Visit: Payer: Self-pay | Admitting: Cardiology

## 2019-10-26 DIAGNOSIS — E1165 Type 2 diabetes mellitus with hyperglycemia: Secondary | ICD-10-CM | POA: Diagnosis not present

## 2019-10-26 DIAGNOSIS — E039 Hypothyroidism, unspecified: Secondary | ICD-10-CM | POA: Diagnosis not present

## 2019-10-26 DIAGNOSIS — E7800 Pure hypercholesterolemia, unspecified: Secondary | ICD-10-CM | POA: Diagnosis not present

## 2019-10-26 DIAGNOSIS — N183 Chronic kidney disease, stage 3 unspecified: Secondary | ICD-10-CM | POA: Diagnosis not present

## 2019-10-26 DIAGNOSIS — K2100 Gastro-esophageal reflux disease with esophagitis, without bleeding: Secondary | ICD-10-CM | POA: Diagnosis not present

## 2019-10-30 DIAGNOSIS — E7800 Pure hypercholesterolemia, unspecified: Secondary | ICD-10-CM | POA: Diagnosis not present

## 2019-10-30 DIAGNOSIS — Z6836 Body mass index (BMI) 36.0-36.9, adult: Secondary | ICD-10-CM | POA: Diagnosis not present

## 2019-10-30 DIAGNOSIS — E039 Hypothyroidism, unspecified: Secondary | ICD-10-CM | POA: Diagnosis not present

## 2019-10-30 DIAGNOSIS — R5383 Other fatigue: Secondary | ICD-10-CM | POA: Diagnosis not present

## 2019-10-30 DIAGNOSIS — I10 Essential (primary) hypertension: Secondary | ICD-10-CM | POA: Diagnosis not present

## 2019-10-30 DIAGNOSIS — H81319 Aural vertigo, unspecified ear: Secondary | ICD-10-CM | POA: Diagnosis not present

## 2019-10-30 DIAGNOSIS — R7301 Impaired fasting glucose: Secondary | ICD-10-CM | POA: Diagnosis not present

## 2020-01-04 DIAGNOSIS — Z23 Encounter for immunization: Secondary | ICD-10-CM | POA: Diagnosis not present

## 2020-02-12 DIAGNOSIS — H25813 Combined forms of age-related cataract, bilateral: Secondary | ICD-10-CM | POA: Diagnosis not present

## 2020-02-24 DIAGNOSIS — Z23 Encounter for immunization: Secondary | ICD-10-CM | POA: Diagnosis not present

## 2020-04-24 DIAGNOSIS — I10 Essential (primary) hypertension: Secondary | ICD-10-CM | POA: Diagnosis not present

## 2020-04-24 DIAGNOSIS — E7800 Pure hypercholesterolemia, unspecified: Secondary | ICD-10-CM | POA: Diagnosis not present

## 2020-04-24 DIAGNOSIS — R7301 Impaired fasting glucose: Secondary | ICD-10-CM | POA: Diagnosis not present

## 2020-04-24 DIAGNOSIS — N183 Chronic kidney disease, stage 3 unspecified: Secondary | ICD-10-CM | POA: Diagnosis not present

## 2020-04-24 DIAGNOSIS — K2100 Gastro-esophageal reflux disease with esophagitis, without bleeding: Secondary | ICD-10-CM | POA: Diagnosis not present

## 2020-04-24 DIAGNOSIS — E782 Mixed hyperlipidemia: Secondary | ICD-10-CM | POA: Diagnosis not present

## 2020-04-24 DIAGNOSIS — E1165 Type 2 diabetes mellitus with hyperglycemia: Secondary | ICD-10-CM | POA: Diagnosis not present

## 2020-04-24 DIAGNOSIS — E039 Hypothyroidism, unspecified: Secondary | ICD-10-CM | POA: Diagnosis not present

## 2020-05-02 DIAGNOSIS — Z6836 Body mass index (BMI) 36.0-36.9, adult: Secondary | ICD-10-CM | POA: Diagnosis not present

## 2020-05-02 DIAGNOSIS — H81319 Aural vertigo, unspecified ear: Secondary | ICD-10-CM | POA: Diagnosis not present

## 2020-05-02 DIAGNOSIS — I10 Essential (primary) hypertension: Secondary | ICD-10-CM | POA: Diagnosis not present

## 2020-05-02 DIAGNOSIS — E039 Hypothyroidism, unspecified: Secondary | ICD-10-CM | POA: Diagnosis not present

## 2020-05-02 DIAGNOSIS — E7800 Pure hypercholesterolemia, unspecified: Secondary | ICD-10-CM | POA: Diagnosis not present

## 2020-05-02 DIAGNOSIS — R7301 Impaired fasting glucose: Secondary | ICD-10-CM | POA: Diagnosis not present

## 2020-05-02 DIAGNOSIS — F411 Generalized anxiety disorder: Secondary | ICD-10-CM | POA: Diagnosis not present

## 2020-08-13 DIAGNOSIS — G250 Essential tremor: Secondary | ICD-10-CM | POA: Diagnosis not present

## 2020-08-13 DIAGNOSIS — Z6836 Body mass index (BMI) 36.0-36.9, adult: Secondary | ICD-10-CM | POA: Diagnosis not present

## 2020-10-11 DIAGNOSIS — R609 Edema, unspecified: Secondary | ICD-10-CM | POA: Diagnosis not present

## 2020-10-11 DIAGNOSIS — Z6836 Body mass index (BMI) 36.0-36.9, adult: Secondary | ICD-10-CM | POA: Diagnosis not present

## 2020-10-22 DIAGNOSIS — N183 Chronic kidney disease, stage 3 unspecified: Secondary | ICD-10-CM | POA: Diagnosis not present

## 2020-10-22 DIAGNOSIS — E782 Mixed hyperlipidemia: Secondary | ICD-10-CM | POA: Diagnosis not present

## 2020-10-22 DIAGNOSIS — E039 Hypothyroidism, unspecified: Secondary | ICD-10-CM | POA: Diagnosis not present

## 2020-10-22 DIAGNOSIS — E7849 Other hyperlipidemia: Secondary | ICD-10-CM | POA: Diagnosis not present

## 2020-10-22 DIAGNOSIS — E1165 Type 2 diabetes mellitus with hyperglycemia: Secondary | ICD-10-CM | POA: Diagnosis not present

## 2020-10-25 DIAGNOSIS — E7801 Familial hypercholesterolemia: Secondary | ICD-10-CM | POA: Diagnosis not present

## 2020-10-25 DIAGNOSIS — R5383 Other fatigue: Secondary | ICD-10-CM | POA: Diagnosis not present

## 2020-10-25 DIAGNOSIS — F411 Generalized anxiety disorder: Secondary | ICD-10-CM | POA: Diagnosis not present

## 2020-10-25 DIAGNOSIS — I10 Essential (primary) hypertension: Secondary | ICD-10-CM | POA: Diagnosis not present

## 2020-10-25 DIAGNOSIS — Z0001 Encounter for general adult medical examination with abnormal findings: Secondary | ICD-10-CM | POA: Diagnosis not present

## 2020-10-25 DIAGNOSIS — H81319 Aural vertigo, unspecified ear: Secondary | ICD-10-CM | POA: Diagnosis not present

## 2020-10-25 DIAGNOSIS — E039 Hypothyroidism, unspecified: Secondary | ICD-10-CM | POA: Diagnosis not present

## 2020-10-31 DIAGNOSIS — R220 Localized swelling, mass and lump, head: Secondary | ICD-10-CM | POA: Diagnosis not present

## 2020-10-31 DIAGNOSIS — Z6836 Body mass index (BMI) 36.0-36.9, adult: Secondary | ICD-10-CM | POA: Diagnosis not present

## 2020-12-10 DIAGNOSIS — R5383 Other fatigue: Secondary | ICD-10-CM | POA: Diagnosis not present

## 2020-12-10 DIAGNOSIS — E039 Hypothyroidism, unspecified: Secondary | ICD-10-CM | POA: Diagnosis not present

## 2020-12-11 DIAGNOSIS — Z6835 Body mass index (BMI) 35.0-35.9, adult: Secondary | ICD-10-CM | POA: Diagnosis not present

## 2020-12-11 DIAGNOSIS — M545 Low back pain, unspecified: Secondary | ICD-10-CM | POA: Diagnosis not present

## 2020-12-20 DIAGNOSIS — Z6835 Body mass index (BMI) 35.0-35.9, adult: Secondary | ICD-10-CM | POA: Diagnosis not present

## 2020-12-20 DIAGNOSIS — M545 Low back pain, unspecified: Secondary | ICD-10-CM | POA: Diagnosis not present

## 2021-02-10 DIAGNOSIS — H2513 Age-related nuclear cataract, bilateral: Secondary | ICD-10-CM | POA: Diagnosis not present

## 2021-02-10 DIAGNOSIS — H25013 Cortical age-related cataract, bilateral: Secondary | ICD-10-CM | POA: Diagnosis not present

## 2021-02-21 DIAGNOSIS — E7849 Other hyperlipidemia: Secondary | ICD-10-CM | POA: Diagnosis not present

## 2021-02-21 DIAGNOSIS — E7801 Familial hypercholesterolemia: Secondary | ICD-10-CM | POA: Diagnosis not present

## 2021-02-21 DIAGNOSIS — E7800 Pure hypercholesterolemia, unspecified: Secondary | ICD-10-CM | POA: Diagnosis not present

## 2021-02-21 DIAGNOSIS — I10 Essential (primary) hypertension: Secondary | ICD-10-CM | POA: Diagnosis not present

## 2021-02-21 DIAGNOSIS — E039 Hypothyroidism, unspecified: Secondary | ICD-10-CM | POA: Diagnosis not present

## 2021-02-21 DIAGNOSIS — K2100 Gastro-esophageal reflux disease with esophagitis, without bleeding: Secondary | ICD-10-CM | POA: Diagnosis not present

## 2021-02-21 DIAGNOSIS — R7301 Impaired fasting glucose: Secondary | ICD-10-CM | POA: Diagnosis not present

## 2021-02-21 DIAGNOSIS — N183 Chronic kidney disease, stage 3 unspecified: Secondary | ICD-10-CM | POA: Diagnosis not present

## 2021-02-21 DIAGNOSIS — E782 Mixed hyperlipidemia: Secondary | ICD-10-CM | POA: Diagnosis not present

## 2021-02-25 DIAGNOSIS — E039 Hypothyroidism, unspecified: Secondary | ICD-10-CM | POA: Diagnosis not present

## 2021-02-25 DIAGNOSIS — Z23 Encounter for immunization: Secondary | ICD-10-CM | POA: Diagnosis not present

## 2021-02-25 DIAGNOSIS — Z6834 Body mass index (BMI) 34.0-34.9, adult: Secondary | ICD-10-CM | POA: Diagnosis not present

## 2021-02-25 DIAGNOSIS — I10 Essential (primary) hypertension: Secondary | ICD-10-CM | POA: Diagnosis not present

## 2021-02-25 DIAGNOSIS — H81319 Aural vertigo, unspecified ear: Secondary | ICD-10-CM | POA: Diagnosis not present

## 2021-02-25 DIAGNOSIS — R7301 Impaired fasting glucose: Secondary | ICD-10-CM | POA: Diagnosis not present

## 2021-02-25 DIAGNOSIS — E7801 Familial hypercholesterolemia: Secondary | ICD-10-CM | POA: Diagnosis not present

## 2021-06-24 DIAGNOSIS — E7801 Familial hypercholesterolemia: Secondary | ICD-10-CM | POA: Diagnosis not present

## 2021-06-24 DIAGNOSIS — E7849 Other hyperlipidemia: Secondary | ICD-10-CM | POA: Diagnosis not present

## 2021-06-24 DIAGNOSIS — E782 Mixed hyperlipidemia: Secondary | ICD-10-CM | POA: Diagnosis not present

## 2021-06-24 DIAGNOSIS — E1165 Type 2 diabetes mellitus with hyperglycemia: Secondary | ICD-10-CM | POA: Diagnosis not present

## 2021-06-24 DIAGNOSIS — N183 Chronic kidney disease, stage 3 unspecified: Secondary | ICD-10-CM | POA: Diagnosis not present

## 2021-06-24 DIAGNOSIS — E039 Hypothyroidism, unspecified: Secondary | ICD-10-CM | POA: Diagnosis not present

## 2021-06-24 DIAGNOSIS — E7800 Pure hypercholesterolemia, unspecified: Secondary | ICD-10-CM | POA: Diagnosis not present

## 2021-06-24 DIAGNOSIS — I10 Essential (primary) hypertension: Secondary | ICD-10-CM | POA: Diagnosis not present

## 2021-06-24 DIAGNOSIS — K2100 Gastro-esophageal reflux disease with esophagitis, without bleeding: Secondary | ICD-10-CM | POA: Diagnosis not present

## 2021-07-01 DIAGNOSIS — R7301 Impaired fasting glucose: Secondary | ICD-10-CM | POA: Diagnosis not present

## 2021-07-01 DIAGNOSIS — H81319 Aural vertigo, unspecified ear: Secondary | ICD-10-CM | POA: Diagnosis not present

## 2021-07-01 DIAGNOSIS — M6530 Trigger finger, unspecified finger: Secondary | ICD-10-CM | POA: Diagnosis not present

## 2021-07-01 DIAGNOSIS — I10 Essential (primary) hypertension: Secondary | ICD-10-CM | POA: Diagnosis not present

## 2021-07-01 DIAGNOSIS — F411 Generalized anxiety disorder: Secondary | ICD-10-CM | POA: Diagnosis not present

## 2021-07-01 DIAGNOSIS — R251 Tremor, unspecified: Secondary | ICD-10-CM | POA: Diagnosis not present

## 2021-07-01 DIAGNOSIS — R5383 Other fatigue: Secondary | ICD-10-CM | POA: Diagnosis not present

## 2021-07-01 DIAGNOSIS — E7801 Familial hypercholesterolemia: Secondary | ICD-10-CM | POA: Diagnosis not present

## 2021-07-31 ENCOUNTER — Encounter: Payer: Self-pay | Admitting: Adult Health

## 2021-07-31 ENCOUNTER — Ambulatory Visit (INDEPENDENT_AMBULATORY_CARE_PROVIDER_SITE_OTHER): Payer: Medicare Other | Admitting: Adult Health

## 2021-07-31 VITALS — BP 142/80 | HR 86 | Ht 60.0 in | Wt 175.0 lb

## 2021-07-31 DIAGNOSIS — N814 Uterovaginal prolapse, unspecified: Secondary | ICD-10-CM

## 2021-07-31 NOTE — Progress Notes (Signed)
?  Subjective:  ?  ? Patient ID: Amber Rosales, female   DOB: 1940/07/03, 81 y.o.   MRN: [id number] ? ?HPI ?Amber Rosales is a 81 year old white female,married, PM in complaining of feeeling like something falling out, ?bladder, has urinary frequency and urge incontinence. Her daughter Amber Rosales is with her. ?PCP is Dr Neita Carp.  ? ? ?Review of Systems ?Feels like something falling out ?Has urinary frequency and some urge incontinence ?Denies any problems with BMs ?Not having sex ?Reviewed past medical,surgical, social and family history. Reviewed medications and allergies.  ?   ?Objective:  ? Physical Exam ?BP (!) 142/80 (BP Location: Right Arm, Patient Position: Sitting, Cuff Size: Normal)   Pulse 86   Ht 5' (1.524 m)   Wt 175 lb (79.4 kg)   BMI 34.18 kg/m?   ?  Skin warm and dry.  Lungs: clear to ausculation bilaterally. Cardiovascular: regular rate and rhythm.  ?Skin warm and dry.Pelvic: external genitalia is normal in appearance no lesions, vagina: pale, +cystocele, and pelvic relaxation,urethra has no lesions or masses noted, cervix:smooth, uterus: normal size, shape and contour, non tender, no masses felt, adnexa: no masses or tenderness noted. Bladder is non tender and no masses felt. On rectal exam, no masses mild rectocele. She has hand tremors, she says worse when she is nervous.  ?AA is 0 ?Fall risk is low ? ?  07/31/2021  ?  9:36 AM  ?Depression screen PHQ 2/9  ?Decreased Interest 0  ?Down, Depressed, Hopeless 0  ?PHQ - 2 Score 0  ?Altered sleeping 0  ?Tired, decreased energy 1  ?Change in appetite 0  ?Feeling bad or failure about yourself  0  ?Trouble concentrating 0  ?Moving slowly or fidgety/restless 0  ?Suicidal thoughts 0  ?PHQ-9 Score 1  ?  ? ?  07/31/2021  ?  9:36 AM  ?GAD 7 : Generalized Anxiety Score  ?Nervous, Anxious, on Edge 0  ?Control/stop worrying 0  ?Worry too much - different things 0  ?Trouble relaxing 0  ?Restless 0  ?Easily annoyed or irritable 0  ?Afraid - awful might happen 0  ?Total GAD 7 Score  0  ? ? Examination chaperoned by Malachy Mood LPN ? ?Assessment:  ?   ?1. 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   ?Plan:  ?   ?Return about 08/15/21 to see Dr Despina Hidden for pessary fitting, she not sure if she wants one or not. ?Told her if she did not like it did not have to have  ?   ?

## 2021-08-13 DIAGNOSIS — S63213A Subluxation of metacarpophalangeal joint of left middle finger, initial encounter: Secondary | ICD-10-CM | POA: Diagnosis not present

## 2021-08-13 DIAGNOSIS — S63215A Subluxation of metacarpophalangeal joint of left ring finger, initial encounter: Secondary | ICD-10-CM | POA: Diagnosis not present

## 2021-08-13 DIAGNOSIS — M19042 Primary osteoarthritis, left hand: Secondary | ICD-10-CM | POA: Diagnosis not present

## 2021-08-13 DIAGNOSIS — Z6834 Body mass index (BMI) 34.0-34.9, adult: Secondary | ICD-10-CM | POA: Diagnosis not present

## 2021-08-13 DIAGNOSIS — E039 Hypothyroidism, unspecified: Secondary | ICD-10-CM | POA: Diagnosis not present

## 2021-08-13 DIAGNOSIS — M19041 Primary osteoarthritis, right hand: Secondary | ICD-10-CM | POA: Diagnosis not present

## 2021-08-13 DIAGNOSIS — I10 Essential (primary) hypertension: Secondary | ICD-10-CM | POA: Diagnosis not present

## 2021-08-15 ENCOUNTER — Encounter: Payer: Medicare Other | Admitting: Obstetrics & Gynecology

## 2021-09-24 DIAGNOSIS — Z6834 Body mass index (BMI) 34.0-34.9, adult: Secondary | ICD-10-CM | POA: Diagnosis not present

## 2021-09-24 DIAGNOSIS — S39012A Strain of muscle, fascia and tendon of lower back, initial encounter: Secondary | ICD-10-CM | POA: Diagnosis not present

## 2021-10-30 DIAGNOSIS — R7301 Impaired fasting glucose: Secondary | ICD-10-CM | POA: Diagnosis not present

## 2021-10-30 DIAGNOSIS — E7849 Other hyperlipidemia: Secondary | ICD-10-CM | POA: Diagnosis not present

## 2021-10-30 DIAGNOSIS — E039 Hypothyroidism, unspecified: Secondary | ICD-10-CM | POA: Diagnosis not present

## 2021-10-30 DIAGNOSIS — N183 Chronic kidney disease, stage 3 unspecified: Secondary | ICD-10-CM | POA: Diagnosis not present

## 2021-10-30 DIAGNOSIS — E7801 Familial hypercholesterolemia: Secondary | ICD-10-CM | POA: Diagnosis not present

## 2021-10-30 DIAGNOSIS — I10 Essential (primary) hypertension: Secondary | ICD-10-CM | POA: Diagnosis not present

## 2021-11-04 DIAGNOSIS — H81319 Aural vertigo, unspecified ear: Secondary | ICD-10-CM | POA: Diagnosis not present

## 2021-11-04 DIAGNOSIS — R5383 Other fatigue: Secondary | ICD-10-CM | POA: Diagnosis not present

## 2021-11-04 DIAGNOSIS — I10 Essential (primary) hypertension: Secondary | ICD-10-CM | POA: Diagnosis not present

## 2021-11-04 DIAGNOSIS — F411 Generalized anxiety disorder: Secondary | ICD-10-CM | POA: Diagnosis not present

## 2021-11-04 DIAGNOSIS — E7801 Familial hypercholesterolemia: Secondary | ICD-10-CM | POA: Diagnosis not present

## 2021-11-04 DIAGNOSIS — R251 Tremor, unspecified: Secondary | ICD-10-CM | POA: Diagnosis not present

## 2021-11-04 DIAGNOSIS — M6530 Trigger finger, unspecified finger: Secondary | ICD-10-CM | POA: Diagnosis not present

## 2021-11-04 DIAGNOSIS — Z6834 Body mass index (BMI) 34.0-34.9, adult: Secondary | ICD-10-CM | POA: Diagnosis not present

## 2021-11-04 DIAGNOSIS — E039 Hypothyroidism, unspecified: Secondary | ICD-10-CM | POA: Diagnosis not present

## 2021-11-04 DIAGNOSIS — R7301 Impaired fasting glucose: Secondary | ICD-10-CM | POA: Diagnosis not present

## 2021-11-04 DIAGNOSIS — Z0001 Encounter for general adult medical examination with abnormal findings: Secondary | ICD-10-CM | POA: Diagnosis not present

## 2022-01-13 ENCOUNTER — Ambulatory Visit (INDEPENDENT_AMBULATORY_CARE_PROVIDER_SITE_OTHER): Payer: Medicare Other | Admitting: Diagnostic Neuroimaging

## 2022-01-13 ENCOUNTER — Encounter: Payer: Self-pay | Admitting: *Deleted

## 2022-01-13 VITALS — BP 120/72 | Ht 60.0 in | Wt 171.0 lb

## 2022-01-13 DIAGNOSIS — R251 Tremor, unspecified: Secondary | ICD-10-CM | POA: Diagnosis not present

## 2022-01-13 DIAGNOSIS — G20 Parkinson's disease: Secondary | ICD-10-CM

## 2022-01-13 MED ORDER — CARBIDOPA-LEVODOPA 25-100 MG PO TABS: 1.0000 | ORAL_TABLET | Freq: Three times a day (TID) | ORAL | 6 refills | Status: DC

## 2022-01-13 NOTE — Patient Instructions (Signed)
  PARKINSON'S DISEASE - carb/levo half tab three times a day with meals x 1-2 weeks; then 1 tab three times a day  - refer to home health (PT, OT, social work, nursing) - safety / supervision issues reviewed - daily physical activity / exercise (at least 15-30 minutes) - increase social activities, brain stimulation, games, puzzles, hobbies, crafts, arts, music - aim for at least 7-8 hours sleep per night (or more) - avoid smoking and alcohol - caregiver resources provided - caution with medications, finances, driving - consider sertraline 25mg  daily for anxiety

## 2022-01-13 NOTE — Progress Notes (Signed)
[facility]  PATIENT: Amber Rosales DOB: 03-25-1941  REFERRING CLINICIAN: Sasser, Silvestre Moment, MD HISTORY FROM: patient REASON FOR VISIT: new consult   HISTORICAL  CHIEF COMPLAINT:  Chief Complaint  Patient presents with   New Patient (Initial Visit)    Pt reports not feeling so well. She states she started noticing tremors about 2 years ago. Started out in left hand. When she gets nervous it gets worse. Her daughter states that her mobility is going down and has been falling. She reports having a lot of pain. Room 7 with husband and daughter.    HISTORY OF PRESENT ILLNESS:   81 year old female here for evaluation of tremor.  Symptoms started 2 to 3 years ago with resting, postural and action tremor in the right greater than left upper extremities.  Also with decline in gait and mobility.  Having short shuffling steps, tripping and falling.  No change in speech or swallowing.  Having more issues with anxiety and constipation.  She lives at home with her husband.  Husband has dementia.  2 daughters are here for this visit, 1 lives 20 minutes away and the other 2 hours week.  1 daughter helps with finances and the other daughter helps with food and other tasks.   REVIEW OF SYSTEMS: Full 14 system review of systems performed and negative with exception of: as per HPI.  ALLERGIES: No Known Allergies  HOME MEDICATIONS: Outpatient Medications Prior to Visit  Medication Sig Dispense Refill   hydrochlorothiazide (HYDRODIURIL) 25 MG tablet Take by mouth.     levothyroxine (SYNTHROID, LEVOTHROID) 75 MCG tablet Take 75 mcg by mouth daily.     losartan (COZAAR) 100 MG tablet Take 100 mg by mouth daily.     metoprolol tartrate (LOPRESSOR) 25 MG tablet Take 1 tablet (25 mg total) by mouth 2 (two) times daily. 180 tablet 3   amLODipine (NORVASC) 5 MG tablet TAKE 1 TABLET BY MOUTH EVERY DAY (Patient not taking: Reported on 01/13/2022) 90 tablet 3   No facility-administered  medications prior to visit.    PAST MEDICAL HISTORY: Past Medical History:  Diagnosis Date   Anxiety    Hyperlipidemia    Hypertension    Hypothyroidism    Tremor    Vertigo     PAST SURGICAL HISTORY: Past Surgical History:  Procedure Laterality Date   DILATION AND CURETTAGE OF UTERUS     WISDOM TOOTH EXTRACTION      FAMILY HISTORY: Family History  Problem Relation Age of Onset   Hypertension Mother    Cancer Mother    CAD Father    Heart failure Sister    Heart failure Brother     SOCIAL HISTORY: Social History   Socioeconomic History   Marital status: Married    Spouse name: Not on file   Number of children: Not on file   Years of education: Not on file   Highest education level: Not on file  Occupational History   Not on file  Tobacco Use   Smoking status: Former    Years: 10.00    Types: Cigarettes    Quit date: 12/16/1957    Years since quitting: 64.1   Smokeless tobacco: Never  Vaping Use   Vaping Use: Never used  Substance and Sexual Activity   Alcohol use: No   Drug use: No   Sexual activity: Not Currently    Birth control/protection: Post-menopausal  Other Topics Concern   Not on file  Social History Narrative  Lives with husband   Social Determinants of Health   Financial Resource Strain: Low Risk  (07/31/2021)   Overall Financial Resource Strain (CARDIA)    Difficulty of Paying Living Expenses: Not hard at all  Food Insecurity: No Food Insecurity (07/31/2021)   Hunger Vital Sign    Worried About Running Out of Food in the Last Year: Never true    Ran Out of Food in the Last Year: Never true  Transportation Needs: No Transportation Needs (07/31/2021)   PRAPARE - Hydrologist (Medical): No    Lack of Transportation (Non-Medical): No  Physical Activity: Inactive (07/31/2021)   Exercise Vital Sign    Days of Exercise per Week: 0 days    Minutes of Exercise per Session: 0 min  Stress: No Stress Concern Present  (07/31/2021)   [place]    Feeling of Stress : Only a little  Social Connections: Socially Integrated (07/31/2021)   Social Connection and Isolation Panel [NHANES]    Frequency of Communication with Friends and Family: More than three times a week    Frequency of Social Gatherings with Friends and Family: Twice a week    Attends Religious Services: More than 4 times per year    Active Member of [employer] or Organizations: Yes    Attends Music therapist: More than 4 times per year    Marital Status: Married  Human resources officer Violence: Not At Risk (07/31/2021)   Humiliation, Afraid, Rape, and Kick questionnaire    Fear of Current or Ex-Partner: No    Emotionally Abused: No    Physically Abused: No    Sexually Abused: No     PHYSICAL EXAM  GENERAL EXAM/CONSTITUTIONAL: Vitals:  Vitals:   01/13/22 0921  BP: 120/72  Weight: 171 lb (77.6 kg)  Height: 5' (1.524 m)   Body mass index is 33.4 kg/m. Wt Readings from Last 3 Encounters:  01/13/22 171 lb (77.6 kg)  07/31/21 175 lb (79.4 kg)  03/22/19 180 lb (81.6 kg)   Patient is in no distress; well developed, nourished and groomed; neck is supple  CARDIOVASCULAR: Examination of carotid arteries is normal; no carotid bruits Regular rate and rhythm, no murmurs Examination of peripheral vascular system by observation and palpation is normal  EYES: Ophthalmoscopic exam of optic discs and posterior segments is normal; no papilledema or hemorrhages No results found.  MUSCULOSKELETAL: Gait, strength, tone, movements noted in Neurologic exam below  NEUROLOGIC: MENTAL STATUS:      No data to display         awake, alert, oriented to person, place and time recent and remote memory intact normal attention and concentration language fluent, comprehension intact, naming intact fund of knowledge appropriate  CRANIAL NERVE:  2nd - no papilledema on  fundoscopic exam 2nd, 3rd, 4th, 6th - pupils equal and reactive to light, visual fields full to confrontation, extraocular muscles intact, no nystagmus 5th - facial sensation symmetric 7th - facial strength symmetric 8th - hearing intact 9th - palate elevates symmetrically, uvula midline 11th - shoulder shrug symmetric 12th - tongue protrusion midline MASKED FACIES  MOTOR:  RESTING TREMOR IN BUE (R > L); BRADYKINESIA AND RIGIDITY IN BUE AND BLE 4/5 DIFFUSE STRENGTH ATROPHY AND WEAKNESS IN BILATERAL HAND INTRINSICS  SENSORY:  normal and symmetric to light touch, temperature, vibration  COORDINATION:  finger-nose-finger, fine finger movements normal  REFLEXES:  deep tendon reflexes SLIGHTLY BRISK IN BUE; TRACE IN  BLE  GAIT/STATION:  SHORT STEPS; UNSTEADY; DECR ARM SWING     DIAGNOSTIC DATA (LABS, IMAGING, TESTING) - I reviewed patient records, labs, notes, testing and imaging myself where available.  Lab Results  Component Value Date   WBC 9.2 06/17/2014   HGB 13.5 06/17/2014   HCT 40.9 06/17/2014   MCV 91.1 06/17/2014   PLT 207 06/17/2014      Component Value Date/Time   NA 136 06/17/2014 0957   K 3.4 (L) 06/17/2014 0957   CL 110 06/17/2014 0957   CO2 26 06/17/2014 0957   GLUCOSE 134 (H) 06/17/2014 0957   BUN 22 06/17/2014 0957   CREATININE 1.02 06/17/2014 0957   CREATININE 0.92 02/07/2014 1219   CALCIUM 8.9 06/17/2014 0957   PROT 6.8 06/17/2014 0957   ALBUMIN 3.7 06/17/2014 0957   AST 23 06/17/2014 0957   ALT 18 06/17/2014 0957   ALKPHOS 96 06/17/2014 0957   BILITOT 0.3 06/17/2014 0957   GFRNONAA 53 (L) 06/17/2014 0957   GFRNONAA 62 02/07/2014 1219   GFRAA 62 (L) 06/17/2014 0957   GFRAA 71 02/07/2014 1219   No results found for: "CHOL", "HDL", "LDLCALC", "LDLDIRECT", "TRIG", "CHOLHDL" No results found for: "HGBA1C" No results found for: "VITAMINB12" Lab Results  Component Value Date   TSH 3.130 12/16/2013    04/03/16 CT head  - Normal head CT  for age.    ASSESSMENT AND PLAN  81 y.o. year old female here with:   Dx:  1. Tremor   2. Parkinson's disease ([place])      PLAN:  PARKINSON'S DISEASE - carb/levo half tab three times a day with meals x 1-2 weeks; then 1 tab three times a day  - refer to home health (PT, OT, social work, nursing) - safety / supervision issues reviewed - daily physical activity / exercise (at least 15-30 minutes) - increase social activities, brain stimulation, games, puzzles, hobbies, crafts, arts, music - aim for at least 7-8 hours sleep per night (or more) - avoid smoking and alcohol - caregiver resources provided - caution with medications, finances, driving - consider sertraline 25mg  daily for anxiety   Orders Placed This Encounter  Procedures   For home use only DME Walker rolling   Ambulatory referral to [place] ordered this encounter  Medications   carbidopa-levodopa (SINEMET IR) 25-100 MG tablet    Sig: Take 1 tablet by mouth 3 (three) times daily before meals.    Dispense:  90 tablet    Refill:  6   Return in about 6 months (around 07/14/2022).  I spent 61 minutes of face-to-face and non-face-to-face time with patient.  This included previsit chart review, lab review, study review, order entry, electronic health record documentation, patient education.     Penni Bombard, MD [id number], 0000000 AM Certified in Neurology, Neurophysiology and Neuroimaging  [facility] [address], [place], [place] [address] [phone]

## 2022-01-14 ENCOUNTER — Telehealth: Payer: Self-pay | Admitting: Diagnostic Neuroimaging

## 2022-01-14 NOTE — Telephone Encounter (Signed)
[facility] is going to take this patient. 

## 2022-01-15 NOTE — Progress Notes (Signed)
DME order re: Rolator walker faxed to [employer], received confirmation.

## 2022-01-16 ENCOUNTER — Encounter: Payer: Self-pay | Admitting: Obstetrics & Gynecology

## 2022-01-16 ENCOUNTER — Ambulatory Visit (INDEPENDENT_AMBULATORY_CARE_PROVIDER_SITE_OTHER): Payer: Medicare Other | Admitting: Obstetrics & Gynecology

## 2022-01-16 VITALS — BP 164/100 | HR 97

## 2022-01-16 DIAGNOSIS — N8110 Cystocele, unspecified: Secondary | ICD-10-CM

## 2022-01-16 NOTE — Progress Notes (Signed)
Chief Complaint  Patient presents with   pessary fitting    Blood pressure (!) 164/100, pulse 97.  Amber Rosales presents today for evaluation and fitting of a pessary She was examined by Derrek Monaco in the spring and noted to have a cystocoele It is symptomatic with difficulty generating pressure to void, the OAB due to volume overload, I think She has to push her bladder back in at times  On exam she has a Grade 3 cystocoele, insignificant uterine prolapse and no rectal prolpase is appreciated No masses otherwise and non tender exam Narrow in transverse dimension She is fit today for a milex ring with support #3 which comes to the pubic arch pretty snuggly     The pessary is removed, and replaced without difficulty.      ICD-10-CM   1. Baden-Walker grade 3 cystocele  N81.10    Fit for a Milex ring with support #3 01/16/22       Amber Rosales will be sen back in 1 months for continued follow up.  Florian Buff, MD  01/16/2022 10:50 AM

## 2022-02-04 ENCOUNTER — Encounter: Payer: Self-pay | Admitting: Obstetrics & Gynecology

## 2022-02-05 ENCOUNTER — Encounter: Payer: Self-pay | Admitting: Diagnostic Neuroimaging

## 2022-02-09 NOTE — Addendum Note (Signed)
Addended by: Florian Buff C on: 02/09/2022 10:30 AM   Modules accepted: Orders

## 2022-02-10 DIAGNOSIS — I10 Essential (primary) hypertension: Secondary | ICD-10-CM | POA: Diagnosis not present

## 2022-02-10 DIAGNOSIS — Z9181 History of falling: Secondary | ICD-10-CM | POA: Diagnosis not present

## 2022-02-10 DIAGNOSIS — E039 Hypothyroidism, unspecified: Secondary | ICD-10-CM | POA: Diagnosis not present

## 2022-02-10 DIAGNOSIS — K5900 Constipation, unspecified: Secondary | ICD-10-CM | POA: Diagnosis not present

## 2022-02-10 DIAGNOSIS — F419 Anxiety disorder, unspecified: Secondary | ICD-10-CM | POA: Diagnosis not present

## 2022-02-10 DIAGNOSIS — I498 Other specified cardiac arrhythmias: Secondary | ICD-10-CM | POA: Diagnosis not present

## 2022-02-10 DIAGNOSIS — G20A1 Parkinson's disease without dyskinesia, without mention of fluctuations: Secondary | ICD-10-CM | POA: Diagnosis not present

## 2022-02-10 DIAGNOSIS — Z87891 Personal history of nicotine dependence: Secondary | ICD-10-CM | POA: Diagnosis not present

## 2022-02-10 DIAGNOSIS — I471 Supraventricular tachycardia, unspecified: Secondary | ICD-10-CM | POA: Diagnosis not present

## 2022-02-10 DIAGNOSIS — E785 Hyperlipidemia, unspecified: Secondary | ICD-10-CM | POA: Diagnosis not present

## 2022-02-19 ENCOUNTER — Ambulatory Visit (INDEPENDENT_AMBULATORY_CARE_PROVIDER_SITE_OTHER): Payer: Medicare Other | Admitting: Obstetrics & Gynecology

## 2022-02-19 VITALS — BP 138/80

## 2022-02-19 DIAGNOSIS — N8110 Cystocele, unspecified: Secondary | ICD-10-CM

## 2022-02-19 DIAGNOSIS — Z4689 Encounter for fitting and adjustment of other specified devices: Secondary | ICD-10-CM | POA: Diagnosis not present

## 2022-02-20 DIAGNOSIS — I498 Other specified cardiac arrhythmias: Secondary | ICD-10-CM | POA: Diagnosis not present

## 2022-02-20 DIAGNOSIS — I10 Essential (primary) hypertension: Secondary | ICD-10-CM | POA: Diagnosis not present

## 2022-02-20 DIAGNOSIS — K5900 Constipation, unspecified: Secondary | ICD-10-CM | POA: Diagnosis not present

## 2022-02-20 DIAGNOSIS — I471 Supraventricular tachycardia, unspecified: Secondary | ICD-10-CM | POA: Diagnosis not present

## 2022-02-20 DIAGNOSIS — G20A1 Parkinson's disease without dyskinesia, without mention of fluctuations: Secondary | ICD-10-CM | POA: Diagnosis not present

## 2022-02-20 DIAGNOSIS — F419 Anxiety disorder, unspecified: Secondary | ICD-10-CM | POA: Diagnosis not present

## 2022-02-24 ENCOUNTER — Encounter: Payer: Self-pay | Admitting: Obstetrics & Gynecology

## 2022-02-24 NOTE — Progress Notes (Signed)
Chief Complaint  Patient presents with   Pessary Check    Blood pressure 138/80.  Amber Rosales presents today for routine follow up related to her pessary.   She uses a Milex ring with support #3 She reports no vaginal discharge and no vaginal bleeding   Likert scale(1 not bothersome -5 very bothersome)  :  1  Exam reveals no undue vaginal mucosal pressure of breakdown, no discharge and no vaginal bleeding.  Vaginal Epithelial Abnormality Classification System:   0 0    No abnormalities 1    Epithelial erythema 2    Granulation tissue 3    Epithelial break or erosion, 1 cm or less 4    Epithelial break or erosion, 1 cm or greater  The pessary is removed, cleaned and replaced without difficulty.      ICD-10-CM   1. Pessary maintenance: Milex ring with support #3  Z46.89     2. Baden-Walker grade 3 cystocele  N81.10        Amber Rosales will be sen back in 3 months for continued follow up.  Florian Buff, MD  02/24/2022 2:16 PM

## 2022-02-25 ENCOUNTER — Ambulatory Visit: Payer: Medicare Other | Admitting: Obstetrics & Gynecology

## 2022-03-02 DIAGNOSIS — E1165 Type 2 diabetes mellitus with hyperglycemia: Secondary | ICD-10-CM | POA: Diagnosis not present

## 2022-03-02 DIAGNOSIS — R5383 Other fatigue: Secondary | ICD-10-CM | POA: Diagnosis not present

## 2022-03-02 DIAGNOSIS — F419 Anxiety disorder, unspecified: Secondary | ICD-10-CM | POA: Diagnosis not present

## 2022-03-02 DIAGNOSIS — E039 Hypothyroidism, unspecified: Secondary | ICD-10-CM | POA: Diagnosis not present

## 2022-03-02 DIAGNOSIS — N183 Chronic kidney disease, stage 3 unspecified: Secondary | ICD-10-CM | POA: Diagnosis not present

## 2022-03-02 DIAGNOSIS — I10 Essential (primary) hypertension: Secondary | ICD-10-CM | POA: Diagnosis not present

## 2022-03-02 DIAGNOSIS — G20A1 Parkinson's disease without dyskinesia, without mention of fluctuations: Secondary | ICD-10-CM | POA: Diagnosis not present

## 2022-03-02 DIAGNOSIS — I498 Other specified cardiac arrhythmias: Secondary | ICD-10-CM | POA: Diagnosis not present

## 2022-03-02 DIAGNOSIS — K2100 Gastro-esophageal reflux disease with esophagitis, without bleeding: Secondary | ICD-10-CM | POA: Diagnosis not present

## 2022-03-02 DIAGNOSIS — E785 Hyperlipidemia, unspecified: Secondary | ICD-10-CM | POA: Diagnosis not present

## 2022-03-02 DIAGNOSIS — E7849 Other hyperlipidemia: Secondary | ICD-10-CM | POA: Diagnosis not present

## 2022-03-02 DIAGNOSIS — K5900 Constipation, unspecified: Secondary | ICD-10-CM | POA: Diagnosis not present

## 2022-03-02 DIAGNOSIS — I471 Supraventricular tachycardia, unspecified: Secondary | ICD-10-CM | POA: Diagnosis not present

## 2022-03-05 DIAGNOSIS — R251 Tremor, unspecified: Secondary | ICD-10-CM | POA: Diagnosis not present

## 2022-03-05 DIAGNOSIS — M6530 Trigger finger, unspecified finger: Secondary | ICD-10-CM | POA: Diagnosis not present

## 2022-03-05 DIAGNOSIS — R7301 Impaired fasting glucose: Secondary | ICD-10-CM | POA: Diagnosis not present

## 2022-03-05 DIAGNOSIS — R5383 Other fatigue: Secondary | ICD-10-CM | POA: Diagnosis not present

## 2022-03-05 DIAGNOSIS — F411 Generalized anxiety disorder: Secondary | ICD-10-CM | POA: Diagnosis not present

## 2022-03-05 DIAGNOSIS — Z23 Encounter for immunization: Secondary | ICD-10-CM | POA: Diagnosis not present

## 2022-03-05 DIAGNOSIS — I10 Essential (primary) hypertension: Secondary | ICD-10-CM | POA: Diagnosis not present

## 2022-03-05 DIAGNOSIS — N1831 Chronic kidney disease, stage 3a: Secondary | ICD-10-CM | POA: Diagnosis not present

## 2022-03-05 DIAGNOSIS — E7801 Familial hypercholesterolemia: Secondary | ICD-10-CM | POA: Diagnosis not present

## 2022-03-05 DIAGNOSIS — G20A1 Parkinson's disease without dyskinesia, without mention of fluctuations: Secondary | ICD-10-CM | POA: Diagnosis not present

## 2022-03-05 DIAGNOSIS — E039 Hypothyroidism, unspecified: Secondary | ICD-10-CM | POA: Diagnosis not present

## 2022-03-05 DIAGNOSIS — H81319 Aural vertigo, unspecified ear: Secondary | ICD-10-CM | POA: Diagnosis not present

## 2022-05-14 ENCOUNTER — Encounter: Payer: Self-pay | Admitting: Obstetrics & Gynecology

## 2022-05-14 ENCOUNTER — Ambulatory Visit (INDEPENDENT_AMBULATORY_CARE_PROVIDER_SITE_OTHER): Payer: Medicare Other | Admitting: Obstetrics & Gynecology

## 2022-05-14 VITALS — BP 156/104 | HR 98 | Wt 169.8 lb

## 2022-05-14 DIAGNOSIS — N8110 Cystocele, unspecified: Secondary | ICD-10-CM

## 2022-05-14 DIAGNOSIS — Z4689 Encounter for fitting and adjustment of other specified devices: Secondary | ICD-10-CM

## 2022-05-14 NOTE — Progress Notes (Signed)
Chief Complaint  Patient presents with   Pessary Maintenance    Blood pressure (!) 156/104, pulse 98, weight 169 lb 12.8 oz (77 kg).  Amber Rosales presents today for routine follow up related to her pessary.   She uses a Milex ring with support #3 She reports no vaginal discharge and no vaginal bleeding   Likert scale(1 not bothersome -5 very bothersome)  :  1  Exam reveals no undue vaginal mucosal pressure of breakdown, no discharge and no vaginal bleeding.  Vaginal Epithelial Abnormality Classification System:   0 0    No abnormalities 1    Epithelial erythema 2    Granulation tissue 3    Epithelial break or erosion, 1 cm or less 4    Epithelial break or erosion, 1 cm or greater  The pessary is removed, cleaned and replaced without difficulty.      ICD-10-CM   1. Pessary maintenance: Milex ring with support #3, placed 01/10/22  Z46.89     2. Baden-Walker grade 3 cystocele  N81.10        Amber Rosales will be sen back in 4 months for continued follow up.  Florian Buff, MD  05/14/2022 9:39 AM

## 2022-05-22 ENCOUNTER — Ambulatory Visit: Payer: Medicare Other | Admitting: Obstetrics & Gynecology

## 2022-07-13 ENCOUNTER — Ambulatory Visit: Payer: Medicare Other | Admitting: Diagnostic Neuroimaging

## 2022-09-01 DIAGNOSIS — K2100 Gastro-esophageal reflux disease with esophagitis, without bleeding: Secondary | ICD-10-CM | POA: Diagnosis not present

## 2022-09-01 DIAGNOSIS — E7849 Other hyperlipidemia: Secondary | ICD-10-CM | POA: Diagnosis not present

## 2022-09-01 DIAGNOSIS — E1122 Type 2 diabetes mellitus with diabetic chronic kidney disease: Secondary | ICD-10-CM | POA: Diagnosis not present

## 2022-09-08 DIAGNOSIS — I10 Essential (primary) hypertension: Secondary | ICD-10-CM | POA: Diagnosis not present

## 2022-09-08 DIAGNOSIS — R5383 Other fatigue: Secondary | ICD-10-CM | POA: Diagnosis not present

## 2022-09-08 DIAGNOSIS — N1831 Chronic kidney disease, stage 3a: Secondary | ICD-10-CM | POA: Diagnosis not present

## 2022-09-08 DIAGNOSIS — Z23 Encounter for immunization: Secondary | ICD-10-CM | POA: Diagnosis not present

## 2022-09-08 DIAGNOSIS — G20A1 Parkinson's disease without dyskinesia, without mention of fluctuations: Secondary | ICD-10-CM | POA: Diagnosis not present

## 2022-09-08 DIAGNOSIS — M6530 Trigger finger, unspecified finger: Secondary | ICD-10-CM | POA: Diagnosis not present

## 2022-09-08 DIAGNOSIS — R251 Tremor, unspecified: Secondary | ICD-10-CM | POA: Diagnosis not present

## 2022-09-08 DIAGNOSIS — R7301 Impaired fasting glucose: Secondary | ICD-10-CM | POA: Diagnosis not present

## 2022-09-08 DIAGNOSIS — H81319 Aural vertigo, unspecified ear: Secondary | ICD-10-CM | POA: Diagnosis not present

## 2022-09-08 DIAGNOSIS — F411 Generalized anxiety disorder: Secondary | ICD-10-CM | POA: Diagnosis not present

## 2022-09-08 DIAGNOSIS — E039 Hypothyroidism, unspecified: Secondary | ICD-10-CM | POA: Diagnosis not present

## 2022-09-08 DIAGNOSIS — E7801 Familial hypercholesterolemia: Secondary | ICD-10-CM | POA: Diagnosis not present

## 2022-09-10 ENCOUNTER — Encounter: Payer: Self-pay | Admitting: Obstetrics & Gynecology

## 2022-09-10 ENCOUNTER — Ambulatory Visit (INDEPENDENT_AMBULATORY_CARE_PROVIDER_SITE_OTHER): Payer: Medicare Other | Admitting: Obstetrics & Gynecology

## 2022-09-10 VITALS — BP 158/91 | HR 84

## 2022-09-10 DIAGNOSIS — N8110 Cystocele, unspecified: Secondary | ICD-10-CM | POA: Diagnosis not present

## 2022-09-10 DIAGNOSIS — Z4689 Encounter for fitting and adjustment of other specified devices: Secondary | ICD-10-CM

## 2022-09-10 NOTE — Progress Notes (Signed)
Chief Complaint  Patient presents with   Pessary Check    Blood pressure (!) 158/91, pulse 84.  Amber Rosales presents today for routine follow up related to her pessary.   She uses a Milex ring with support #3 She reports no vaginal discharge and no vaginal bleeding   Likert scale(1 not bothersome -5 very bothersome)  :  1  Exam reveals no undue vaginal mucosal pressure of breakdown, no discharge and no vaginal bleeding.  Vaginal Epithelial Abnormality Classification System:   0 0    No abnormalities 1    Epithelial erythema 2    Granulation tissue 3    Epithelial break or erosion, 1 cm or less 4    Epithelial break or erosion, 1 cm or greater  The pessary is removed, cleaned and replaced without difficulty.      ICD-10-CM   1. Pessary maintenance: Milex ring with support #3, placed 01/10/22  Z46.89     2. Baden-Walker grade 3 cystocele  N81.10        [name] will be sen back in 3 months for continued follow up.  Lazaro Arms, MD  09/10/2022 9:41 AM

## 2022-09-22 DIAGNOSIS — L918 Other hypertrophic disorders of the skin: Secondary | ICD-10-CM | POA: Diagnosis not present

## 2022-12-01 DIAGNOSIS — N23 Unspecified renal colic: Secondary | ICD-10-CM | POA: Diagnosis not present

## 2022-12-01 DIAGNOSIS — R300 Dysuria: Secondary | ICD-10-CM | POA: Diagnosis not present

## 2022-12-01 DIAGNOSIS — N390 Urinary tract infection, site not specified: Secondary | ICD-10-CM | POA: Diagnosis not present

## 2022-12-29 ENCOUNTER — Other Ambulatory Visit: Payer: Self-pay

## 2022-12-29 MED ORDER — CARBIDOPA-LEVODOPA 25-100 MG PO TABS: 1.0000 | ORAL_TABLET | Freq: Three times a day (TID) | ORAL | 0 refills | Status: DC

## 2023-01-15 ENCOUNTER — Ambulatory Visit (INDEPENDENT_AMBULATORY_CARE_PROVIDER_SITE_OTHER): Payer: Medicare Other | Admitting: Obstetrics & Gynecology

## 2023-01-15 ENCOUNTER — Encounter: Payer: Self-pay | Admitting: Obstetrics & Gynecology

## 2023-01-15 VITALS — BP 130/83 | HR 98 | Wt 146.0 lb

## 2023-01-15 DIAGNOSIS — Z4689 Encounter for fitting and adjustment of other specified devices: Secondary | ICD-10-CM

## 2023-01-15 DIAGNOSIS — N8110 Cystocele, unspecified: Secondary | ICD-10-CM

## 2023-01-15 DIAGNOSIS — N814 Uterovaginal prolapse, unspecified: Secondary | ICD-10-CM

## 2023-01-15 NOTE — Progress Notes (Signed)
Chief Complaint  Patient presents with   Pessary Check    Blood pressure (!) 158/100, pulse 90, weight 146 lb (66.2 kg).  Amber Rosales presents today for routine follow up related to her pessary.   She uses a Milex ring with support #3 She reports no vaginal discharge and no vaginal bleeding   Likert scale(1 not bothersome -5 very bothersome)  :  1  Exam reveals no undue vaginal mucosal pressure of breakdown, no discharge and no vaginal bleeding.  Vaginal Epithelial Abnormality Classification System:   0 0    No abnormalities 1    Epithelial erythema 2    Granulation tissue 3    Epithelial break or erosion, 1 cm or less 4    Epithelial break or erosion, 1 cm or greater  The pessary is removed, cleaned and replaced without difficulty.      ICD-10-CM   1. Pessary maintenance: Milex ring with support #3, placed 01/10/22  Z46.89     2. Baden-Walker grade 3 cystocele  N81.10     3. 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  Lazaro Arms, MD  01/15/2023 9:12 AM

## 2023-01-18 ENCOUNTER — Ambulatory Visit (INDEPENDENT_AMBULATORY_CARE_PROVIDER_SITE_OTHER): Payer: Medicare Other | Admitting: Diagnostic Neuroimaging

## 2023-01-18 ENCOUNTER — Encounter: Payer: Self-pay | Admitting: Diagnostic Neuroimaging

## 2023-01-18 VITALS — BP 130/77 | HR 88 | Ht 59.0 in | Wt 146.0 lb

## 2023-01-18 DIAGNOSIS — G20A1 Parkinson's disease without dyskinesia, without mention of fluctuations: Secondary | ICD-10-CM

## 2023-01-18 MED ORDER — CARBIDOPA-LEVODOPA 25-100 MG PO TABS: 1.0000 | ORAL_TABLET | Freq: Three times a day (TID) | ORAL | 4 refills | Status: AC

## 2023-01-18 NOTE — Patient Instructions (Signed)
PARKINSON'S DISEASE - continue carb/levo 1 tab three times a day  - refer to PT for gait training; use walker - safety / supervision issues reviewed - daily physical activity / exercise (at least 15-30 minutes) - increase social activities, brain stimulation, games, puzzles, hobbies, crafts, arts, music - aim for at least 7-8 hours sleep per night (or more) - avoid smoking and alcohol - caution with medications, finances; no driving

## 2023-01-18 NOTE — Progress Notes (Signed)
[facility]  PATIENT: Amber Rosales DOB: Oct 31, 1940  REFERRING CLINICIAN: Sasser, Clarene Critchley, MD HISTORY FROM: patient and daughter REASON FOR VISIT: follow up   HISTORICAL  CHIEF COMPLAINT:  Chief Complaint  Patient presents with   Follow-up    Patient in room #6 with her daughter. Patient states she has hasn't been feeling good and having weakness in both legs.    HISTORY OF PRESENT ILLNESS:   UPDATE (01/18/23, VRP): Since last visit, now living with daughters. Her husband now in SNF / memory center. Tolerating carb/levo, and helping tremor and energy.    PRIOR HPI (01/13/22): 82 year old female here for evaluation of tremor.  Symptoms started 2 to 3 years ago with resting, postural and action tremor in the right greater than left upper extremities.  Also with decline in gait and mobility.  Having short shuffling steps, tripping and falling.  No change in speech or swallowing.  Having more issues with anxiety and constipation.  She lives at home with her husband.  Husband has dementia.  2 daughters are here for this visit, 1 lives 20 minutes away and the other 2 hours week.  1 daughter helps with finances and the other daughter helps with food and other tasks.   REVIEW OF SYSTEMS: Full 14 system review of systems performed and negative with exception of: as per HPI.  ALLERGIES: No Known Allergies  HOME MEDICATIONS: Outpatient Medications Prior to Visit  Medication Sig Dispense Refill   hydrochlorothiazide (HYDRODIURIL) 25 MG tablet Take by mouth.     levothyroxine (SYNTHROID, LEVOTHROID) 75 MCG tablet Take 75 mcg by mouth daily.     losartan (COZAAR) 100 MG tablet Take 100 mg by mouth daily.     metoprolol tartrate (LOPRESSOR) 25 MG tablet Take 1 tablet (25 mg total) by mouth 2 (two) times daily. 180 tablet 3   carbidopa-levodopa (SINEMET IR) 25-100 MG tablet Take 1 tablet by mouth 3 (three) times daily before meals. 90 tablet 0   amLODipine (NORVASC) 5 MG  tablet TAKE 1 TABLET BY MOUTH EVERY DAY (Patient not taking: Reported on 01/18/2023) 90 tablet 3   No facility-administered medications prior to visit.    PAST MEDICAL HISTORY: Past Medical History:  Diagnosis Date   Anxiety    Hyperlipidemia    Hypertension    Hypothyroidism    Tremor    Vertigo     PAST SURGICAL HISTORY: Past Surgical History:  Procedure Laterality Date   DILATION AND CURETTAGE OF UTERUS     WISDOM TOOTH EXTRACTION      FAMILY HISTORY: Family History  Problem Relation Age of Onset   Hypertension Mother    Cancer Mother    CAD Father    Heart failure Sister    Heart failure Brother     SOCIAL HISTORY: Social History   Socioeconomic History   Marital status: Married    Spouse name: Not on file   Number of children: Not on file   Years of education: Not on file   Highest education level: Not on file  Occupational History   Not on file  Tobacco Use   Smoking status: Former    Current packs/day: 0.00    Types: Cigarettes    Start date: 12/17/1947    Quit date: 12/16/1957    Years since quitting: 65.1   Smokeless tobacco: Never  Vaping Use   Vaping status: Never Used  Substance and Sexual Activity   Alcohol use: No   Drug use: No  Sexual activity: Not Currently    Birth control/protection: Post-menopausal  Other Topics Concern   Not on file  Social History Narrative   Lives with husband   Social Determinants of Health   Financial Resource Strain: Low Risk  (07/31/2021)   Overall Financial Resource Strain (CARDIA)    Difficulty of Paying Living Expenses: Not hard at all  Food Insecurity: No Food Insecurity (07/31/2021)   Hunger Vital Sign    Worried About Running Out of Food in the Last Year: Never true    Ran Out of Food in the Last Year: Never true  Transportation Needs: No Transportation Needs (07/31/2021)   PRAPARE - Administrator, Civil Service (Medical): No    Lack of Transportation (Non-Medical): No  Physical  Activity: Inactive (07/31/2021)   Exercise Vital Sign    Days of Exercise per Week: 0 days    Minutes of Exercise per Session: 0 min  Stress: No Stress Concern Present (07/31/2021)   [name]    Feeling of Stress : Only a little  Social Connections: Socially Integrated (07/31/2021)   Social Connection and Isolation Panel [NHANES]    Frequency of Communication with Friends and Family: More than three times a week    Frequency of Social Gatherings with Friends and Family: Twice a week    Attends Religious Services: More than 4 times per year    Active Member of [employer] or Organizations: Yes    Attends Engineer, structural: More than 4 times per year    Marital Status: Married  Catering manager Violence: Not At Risk (07/31/2021)   Humiliation, Afraid, Rape, and Kick questionnaire    Fear of Current or Ex-Partner: No    Emotionally Abused: No    Physically Abused: No    Sexually Abused: No     PHYSICAL EXAM  GENERAL EXAM/CONSTITUTIONAL: Vitals:  Vitals:   01/18/23 1529  BP: 130/77  Pulse: 88  Weight: 146 lb (66.2 kg)  Height: 4\' 11"  (1.499 m)   Body mass index is 29.49 kg/m. Wt Readings from Last 3 Encounters:  01/18/23 146 lb (66.2 kg)  01/15/23 146 lb (66.2 kg)  05/14/22 169 lb 12.8 oz (77 kg)   Patient is in no distress; well developed, nourished and groomed; neck is supple  CARDIOVASCULAR: Examination of carotid arteries is normal; no carotid bruits Regular rate and rhythm, no murmurs Examination of peripheral vascular system by observation and palpation is normal  EYES: Ophthalmoscopic exam of optic discs and posterior segments is normal; no papilledema or hemorrhages No results found.  MUSCULOSKELETAL: Gait, strength, tone, movements noted in Neurologic exam below  NEUROLOGIC: MENTAL STATUS:      No data to display         awake, alert, oriented to person, place and time recent  and remote memory intact normal attention and concentration language fluent, comprehension intact, naming intact fund of knowledge appropriate  CRANIAL NERVE:  2nd - no papilledema on fundoscopic exam 2nd, 3rd, 4th, 6th - pupils equal and reactive to light, visual fields full to confrontation, extraocular muscles intact, no nystagmus 5th - facial sensation symmetric 7th - facial strength symmetric 8th - hearing intact 9th - palate elevates symmetrically, uvula midline 11th - shoulder shrug symmetric 12th - tongue protrusion midline MASKED FACIES  MOTOR:  MILD RARE, RESTING TREMOR IN L > R upper ext; BRADYKINESIA AND RIGIDITY IN BUE AND BLE (left worse than right) 4/5 DIFFUSE STRENGTH  ATROPHY AND WEAKNESS IN BILATERAL HAND INTRINSICS  SENSORY:  normal and symmetric to light touch, temperature, vibration  COORDINATION:  finger-nose-finger, fine finger movements normal  REFLEXES:  deep tendon reflexes 1+ BUE; TRACE IN BLE  GAIT/STATION:  SHORT STEPS; UNSTEADY; DECR ARM SWING     DIAGNOSTIC DATA (LABS, IMAGING, TESTING) - I reviewed patient records, labs, notes, testing and imaging myself where available.  Lab Results  Component Value Date   WBC 9.2 06/17/2014   HGB 13.5 06/17/2014   HCT 40.9 06/17/2014   MCV 91.1 06/17/2014   PLT 207 06/17/2014      Component Value Date/Time   NA 136 06/17/2014 0957   K 3.4 (L) 06/17/2014 0957   CL 110 06/17/2014 0957   CO2 26 06/17/2014 0957   GLUCOSE 134 (H) 06/17/2014 0957   BUN 22 06/17/2014 0957   CREATININE 1.02 06/17/2014 0957   CREATININE 0.92 02/07/2014 1219   CALCIUM 8.9 06/17/2014 0957   PROT 6.8 06/17/2014 0957   ALBUMIN 3.7 06/17/2014 0957   AST 23 06/17/2014 0957   ALT 18 06/17/2014 0957   ALKPHOS 96 06/17/2014 0957   BILITOT 0.3 06/17/2014 0957   GFRNONAA 53 (L) 06/17/2014 0957   GFRNONAA 62 02/07/2014 1219   GFRAA 62 (L) 06/17/2014 0957   GFRAA 71 02/07/2014 1219   No results found for: "CHOL", "HDL",  "LDLCALC", "LDLDIRECT", "TRIG", "CHOLHDL" No results found for: "HGBA1C" No results found for: "VITAMINB12" Lab Results  Component Value Date   TSH 3.130 12/16/2013    04/03/16 CT head  - Normal head CT for age.    ASSESSMENT AND PLAN  82 y.o. year old female here with:   Dx:  1. 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     PLAN:  PARKINSON'S DISEASE - continue carb/levo 1 tab three times a day  - refer to PT for gait training; use walker - safety / supervision issues reviewed - daily physical activity / exercise (at least 15-30 minutes) - increase social activities, brain stimulation, games, puzzles, hobbies, crafts, arts, music - aim for at least 7-8 hours sleep per night (or more) - avoid smoking and alcohol - caution with medications, finances; no driving  Orders Placed This Encounter  Procedures   Ambulatory referral to Physical Therapy   Meds ordered this encounter  Medications   carbidopa-levodopa (SINEMET IR) 25-100 MG tablet    Sig: Take 1 tablet by mouth 3 (three) times daily before meals.    Dispense:  270 tablet    Refill:  4   Return in about 1 year (around 01/18/2024).     Suanne Marker, MD 01/18/2023, 4:02 PM Certified in Neurology, Neurophysiology and Neuroimaging  [facility] [address], [address] [place], [place] [address] [phone]

## 2023-02-12 ENCOUNTER — Other Ambulatory Visit: Payer: Self-pay

## 2023-02-12 ENCOUNTER — Ambulatory Visit (HOSPITAL_COMMUNITY): Payer: Medicare Other | Attending: Diagnostic Neuroimaging

## 2023-02-12 DIAGNOSIS — R2681 Unsteadiness on feet: Secondary | ICD-10-CM | POA: Insufficient documentation

## 2023-02-12 DIAGNOSIS — G20A1 Parkinson's disease without dyskinesia, without mention of fluctuations: Secondary | ICD-10-CM | POA: Insufficient documentation

## 2023-02-12 NOTE — Therapy (Signed)
[facility] OUTPATIENT PHYSICAL THERAPY NEURO EVALUATION   Patient Name: Amber Rosales MRN: [id number] DOB:1940/10/02, 82 y.o., female Today's Date: 02/12/2023  END OF SESSION:   PT End of Session - 02/12/23 0854     Visit Number 1    Number of Visits 1    Authorization Type [employer]    PT Start Time 0845    PT Stop Time 0925    PT Time Calculation (min) 40 min    Activity Tolerance Patient tolerated treatment well    Behavior During Therapy [facility] for tasks assessed/performed             Past Medical History:  Diagnosis Date   Anxiety    Hyperlipidemia    Hypertension    Hypothyroidism    Tremor    Vertigo    Past Surgical History:  Procedure Laterality Date   DILATION AND CURETTAGE OF UTERUS     WISDOM TOOTH EXTRACTION     Patient Active Problem List   Diagnosis Date Noted   PSVT (paroxysmal supraventricular tachycardia) (HCC) 12/18/2013   Paroxysmal cardiac arrhythmia 12/16/2013   Heart palpitations 12/16/2013   HTN (hypertension) 12/16/2013   Hyperlipidemia 12/16/2013   PCP: Estanislado Pandy, MDPCP - General   REFERRING PROVIDER: Suanne Marker, MDRef Provider   ONSET DATE: 1 year +  REFERRING DIAG: G20.A1 (ICD-10-CM) - 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  Rationale for Evaluation and Treatment: Rehabilitation  SUBJECTIVE:                                                                                                                                                                                             SUBJECTIVE STATEMENT: Patient went to Neurologist in September. Patient's balance has been steadily decreasing. Patient had 2x/ falls last 6 months so her daughter had patient move into her home for support. Pt accompanied by: family member  PERTINENT HISTORY:  Parkinson's diagnosis 2023- takes levadopa 3x/ day   PAIN:  Are you having pain? No  PRECAUTIONS:  None  RED FLAGS: None   WEIGHT BEARING RESTRICTIONS: No  FALLS: Has patient fallen in last 6 months? Yes. Number of falls 2  LIVING ENVIRONMENT: Lives with: lives with their family Lives in: House/apartment Stairs: No Has following equipment at home: Single point cane and rollaotr  PLOF: Independent with household mobility without device  PATIENT SURVEYS:    PATIENT GOALS:   OBJECTIVE:   DIAGNOSTIC FINDINGS: n/a  COGNITION: Overall cognitive status: Within functional limits for tasks assessed  SENSATION: WFL   POSTURE: rounded shoulders, forward  head, and decreased lumbar lordosis    GAIT ANALYSIS: Gait pattern: decreased step length- Right and decreased step length- Left Distance walked: 33feet Assistive device utilized: Walker - 2 wheeled Level of assistance: SBA Comments: Patient attempted Rollator and rolling walker; improved steadiness using rolling walker     TODAY'S TREATMENT:                                                                                                                              DATE:   02/12/23  Gait Training using both rollator/ rolling walker     PATIENT EDUCATION: Education details: Activity modification, energy conservation  Person educated: Patient Education method: Explanation Education comprehension: verbalized understanding  HOME EXERCISE PROGRAM:   GOALS:   SHORT TERM GOALS:     LONG TERM GOALS:     ASSESSMENT:  CLINICAL IMPRESSION: Patient is a 82 y.o. female who was seen today for physical therapy evaluation and treatment for unsteadiness on feet. Patient was referred to [facility] for DME training. Patient states she would only like a one-time visit to review types of DME for her condition of Parkinson's. PT demonstrated and reviewed the differences in mboility/stability between a Rollator and rolling walker; patient brought in her own Rollator. Based on assessment, PT recommended patient to purchase a  rolling walker based on her functional level and keep her rollator for prolonged community ambulation. No additional PT visits at this moment.   OBJECTIVE IMPAIRMENTS: Abnormal gait, decreased coordination, decreased knowledge of condition, decreased knowledge of use of DME, and difficulty walking.   ACTIVITY LIMITATIONS: standing, stairs, transfers, and locomotion level  PARTICIPATION LIMITATIONS: meal prep, cleaning, shopping, and community activity  PERSONAL FACTORS: 1 comorbidity: parkinsons  are also affecting patient's functional outcome.   REHAB POTENTIAL: Good  CLINICAL DECISION MAKING: Stable/uncomplicated  EVALUATION COMPLEXITY: Low  PLAN:  PT FREQUENCY: one time visit  PT DURATION:   PLANNED INTERVENTIONS: 97110-Therapeutic exercises, 97530- Therapeutic activity, 97112- Neuromuscular re-education, 97535- Self Care, 19147- Manual therapy, [phone]- Gait training, Balance training, Stair training, Joint mobilization, and DME instructions  PLAN FOR NEXT SESSION: one session    Seymour Bars, PT 02/12/2023, 1:55 PM

## 2023-02-15 DIAGNOSIS — Z23 Encounter for immunization: Secondary | ICD-10-CM | POA: Diagnosis not present

## 2023-03-05 DIAGNOSIS — E7849 Other hyperlipidemia: Secondary | ICD-10-CM | POA: Diagnosis not present

## 2023-03-05 DIAGNOSIS — E039 Hypothyroidism, unspecified: Secondary | ICD-10-CM | POA: Diagnosis not present

## 2023-03-05 DIAGNOSIS — N183 Chronic kidney disease, stage 3 unspecified: Secondary | ICD-10-CM | POA: Diagnosis not present

## 2023-03-10 DIAGNOSIS — Z0001 Encounter for general adult medical examination with abnormal findings: Secondary | ICD-10-CM | POA: Diagnosis not present

## 2023-03-10 DIAGNOSIS — R5383 Other fatigue: Secondary | ICD-10-CM | POA: Diagnosis not present

## 2023-03-10 DIAGNOSIS — G20A1 Parkinson's disease without dyskinesia, without mention of fluctuations: Secondary | ICD-10-CM | POA: Diagnosis not present

## 2023-03-10 DIAGNOSIS — R251 Tremor, unspecified: Secondary | ICD-10-CM | POA: Diagnosis not present

## 2023-03-10 DIAGNOSIS — H81319 Aural vertigo, unspecified ear: Secondary | ICD-10-CM | POA: Diagnosis not present

## 2023-03-10 DIAGNOSIS — F411 Generalized anxiety disorder: Secondary | ICD-10-CM | POA: Diagnosis not present

## 2023-03-10 DIAGNOSIS — N1831 Chronic kidney disease, stage 3a: Secondary | ICD-10-CM | POA: Diagnosis not present

## 2023-03-10 DIAGNOSIS — M6530 Trigger finger, unspecified finger: Secondary | ICD-10-CM | POA: Diagnosis not present

## 2023-03-10 DIAGNOSIS — E7801 Familial hypercholesterolemia: Secondary | ICD-10-CM | POA: Diagnosis not present

## 2023-03-10 DIAGNOSIS — Z23 Encounter for immunization: Secondary | ICD-10-CM | POA: Diagnosis not present

## 2023-03-10 DIAGNOSIS — I10 Essential (primary) hypertension: Secondary | ICD-10-CM | POA: Diagnosis not present

## 2023-03-10 DIAGNOSIS — R7301 Impaired fasting glucose: Secondary | ICD-10-CM | POA: Diagnosis not present

## 2023-03-15 ENCOUNTER — Ambulatory Visit: Payer: Medicare Other | Admitting: Adult Health

## 2023-03-15 ENCOUNTER — Encounter: Payer: Self-pay | Admitting: Adult Health

## 2023-03-15 VITALS — BP 126/85 | HR 73 | Ht 59.0 in | Wt 140.4 lb

## 2023-03-15 DIAGNOSIS — N814 Uterovaginal prolapse, unspecified: Secondary | ICD-10-CM

## 2023-03-15 DIAGNOSIS — N8110 Cystocele, unspecified: Secondary | ICD-10-CM | POA: Diagnosis not present

## 2023-03-15 DIAGNOSIS — Z4689 Encounter for fitting and adjustment of other specified devices: Secondary | ICD-10-CM | POA: Diagnosis not present

## 2023-03-15 NOTE — Progress Notes (Signed)
  Subjective:     Patient ID: Amber Rosales, female   DOB: 07-27-1940, 82 y.o.   MRN: [id number]  HPI Amber Rosales is a 82 year old white female, married, PM in for pessary maintenance. Her daughter Amber Rosales is with her today, she will be going to other daughter's in [place] soon and stay for a few months.   PCP is Dr Amber Rosales  Review of Systems She denies any bleeding or vaginal discharge or odor. Reviewed past medical,surgical, social and family history. Reviewed medications and allergies.     Objective:   Physical Exam BP 126/85 (BP Location: Right Arm, Patient Position: Sitting, Cuff Size: Normal) Comment (BP Location): lower arm  Pulse 73   Ht 4\' 11"  (1.499 m)   Wt 140 lb 6.4 oz (63.7 kg)   BMI 28.36 kg/m     Skin warm and dry.Pelvic: external genitalia is normal in appearance no lesions, vagina: pessary #3 milex ring with support, easily removed, mild creamy discharge on pessary, no odor, +cystocele and uterine descensus, no irritation noted, pessary washed with soap and water and dried and easily replaced. Fall risk is low Examination chaperoned by Amber Dus RN   Assessment:     1. Pessary maintenance: Milex ring with support #3, placed 01/10/22 Cleaned and replaced   2. Baden-Walker grade 3 cystocele  3. 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

## 2023-06-22 DIAGNOSIS — E039 Hypothyroidism, unspecified: Secondary | ICD-10-CM | POA: Diagnosis not present

## 2023-06-22 DIAGNOSIS — N1831 Chronic kidney disease, stage 3a: Secondary | ICD-10-CM | POA: Diagnosis not present

## 2023-06-29 DIAGNOSIS — Z23 Encounter for immunization: Secondary | ICD-10-CM | POA: Diagnosis not present

## 2023-06-29 DIAGNOSIS — E7800 Pure hypercholesterolemia, unspecified: Secondary | ICD-10-CM | POA: Diagnosis not present

## 2023-06-29 DIAGNOSIS — E039 Hypothyroidism, unspecified: Secondary | ICD-10-CM | POA: Diagnosis not present

## 2023-06-29 DIAGNOSIS — N1831 Chronic kidney disease, stage 3a: Secondary | ICD-10-CM | POA: Diagnosis not present

## 2023-06-29 DIAGNOSIS — M19041 Primary osteoarthritis, right hand: Secondary | ICD-10-CM | POA: Diagnosis not present

## 2023-06-29 DIAGNOSIS — Z6826 Body mass index (BMI) 26.0-26.9, adult: Secondary | ICD-10-CM | POA: Diagnosis not present

## 2023-07-07 ENCOUNTER — Encounter: Payer: Self-pay | Admitting: Obstetrics & Gynecology

## 2023-07-07 ENCOUNTER — Ambulatory Visit: Payer: Medicare Other | Admitting: Obstetrics & Gynecology

## 2023-07-07 VITALS — BP 127/85 | HR 114 | Wt 137.0 lb

## 2023-07-07 DIAGNOSIS — Z4689 Encounter for fitting and adjustment of other specified devices: Secondary | ICD-10-CM

## 2023-07-07 DIAGNOSIS — N814 Uterovaginal prolapse, unspecified: Secondary | ICD-10-CM | POA: Diagnosis not present

## 2023-07-07 DIAGNOSIS — N8110 Cystocele, unspecified: Secondary | ICD-10-CM

## 2023-07-07 NOTE — Progress Notes (Signed)
 Chief Complaint  Patient presents with   Pessary Check    Blood pressure 127/85, pulse (!) 114, weight 137 lb (62.1 kg).  Amber Rosales presents today for routine follow up related to her pessary.  She lost her husband 3 weeks ago to pneumonia She uses a Milex ring with support #3 She reports no vaginal discharge and no vaginal bleeding   Likert scale(1 not bothersome -5 very bothersome)  :  1  Exam reveals no undue vaginal mucosal pressure of breakdown, no discharge and no vaginal bleeding.  Vaginal Epithelial Abnormality Classification System:   0 0    No abnormalities 1    Epithelial erythema 2    Granulation tissue 3    Epithelial break or erosion, 1 cm or less 4    Epithelial break or erosion, 1 cm or greater  The pessary is removed, cleaned and replaced without difficulty.      ICD-10-CM   1. Pessary maintenance: Milex ring with support #3, placed 01/10/22  Z46.89     2. Baden-Walker grade 3 cystocele  N81.10     3. 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  Lazaro Arms, MD  07/07/2023 10:24 AM

## 2023-08-05 ENCOUNTER — Ambulatory Visit: Payer: Medicare Other | Admitting: Cardiology

## 2023-09-05 ENCOUNTER — Encounter: Payer: Self-pay | Admitting: Diagnostic Neuroimaging

## 2023-09-10 DIAGNOSIS — R300 Dysuria: Secondary | ICD-10-CM | POA: Diagnosis not present

## 2023-09-10 DIAGNOSIS — Z6825 Body mass index (BMI) 25.0-25.9, adult: Secondary | ICD-10-CM | POA: Diagnosis not present

## 2023-11-08 ENCOUNTER — Encounter: Payer: Self-pay | Admitting: Obstetrics & Gynecology

## 2023-11-08 ENCOUNTER — Ambulatory Visit: Admitting: Obstetrics & Gynecology

## 2023-11-08 VITALS — BP 160/107 | HR 105 | Ht 59.0 in | Wt 130.0 lb

## 2023-11-08 DIAGNOSIS — Z4689 Encounter for fitting and adjustment of other specified devices: Secondary | ICD-10-CM | POA: Diagnosis not present

## 2023-11-08 NOTE — Progress Notes (Signed)
 Chief Complaint  Patient presents with   Pessary Check    Blood pressure (!) 160/107, pulse (!) 105, height 4' 11 (1.499 m), weight 130 lb (59 kg).  Amber Rosales presents today for routine follow up related to her pessary.   She uses a Milex ring with support #3 She reports no vaginal discharge and no vaginal bleeding   Likert scale(1 not bothersome -5 very bothersome)  :  1  Exam reveals no undue vaginal mucosal pressure of breakdown, no discharge and no vaginal bleeding.  Vaginal Epithelial Abnormality Classification System:   0 0    No abnormalities 1    Epithelial erythema 2    Granulation tissue 3    Epithelial break or erosion, 1 cm or less 4    Epithelial break or erosion, 1 cm or greater  The pessary is removed, cleaned and replaced without difficulty.      ICD-10-CM   1. Pessary maintenance: Milex ring with support #3, placed 01/10/22  Z46.89        Amber Rosales will be sen back in 4 months for continued follow up.  Vonn VEAR Inch, MD  11/08/2023 3:46 PM

## 2023-11-11 ENCOUNTER — Emergency Department (HOSPITAL_COMMUNITY)

## 2023-11-11 ENCOUNTER — Encounter (HOSPITAL_COMMUNITY): Payer: Self-pay | Admitting: Emergency Medicine

## 2023-11-11 ENCOUNTER — Inpatient Hospital Stay (HOSPITAL_COMMUNITY)
Admission: EM | Admit: 2023-11-11 | Discharge: 2023-11-17 | DRG: 183 | Disposition: A | Discharge status: Skilled Nursing Facility | Attending: General Surgery | Admitting: General Surgery

## 2023-11-11 ENCOUNTER — Other Ambulatory Visit: Payer: Self-pay

## 2023-11-11 DIAGNOSIS — G20C Parkinsonism, unspecified: Secondary | ICD-10-CM | POA: Diagnosis not present

## 2023-11-11 DIAGNOSIS — S2242XA Multiple fractures of ribs, left side, initial encounter for closed fracture: Secondary | ICD-10-CM | POA: Diagnosis not present

## 2023-11-11 DIAGNOSIS — R918 Other nonspecific abnormal finding of lung field: Secondary | ICD-10-CM | POA: Diagnosis not present

## 2023-11-11 DIAGNOSIS — K429 Umbilical hernia without obstruction or gangrene: Secondary | ICD-10-CM | POA: Diagnosis present

## 2023-11-11 DIAGNOSIS — J9811 Atelectasis: Secondary | ICD-10-CM | POA: Diagnosis not present

## 2023-11-11 DIAGNOSIS — W19XXXA Unspecified fall, initial encounter: Secondary | ICD-10-CM | POA: Diagnosis not present

## 2023-11-11 DIAGNOSIS — E039 Hypothyroidism, unspecified: Secondary | ICD-10-CM | POA: Diagnosis present

## 2023-11-11 DIAGNOSIS — Z87891 Personal history of nicotine dependence: Secondary | ICD-10-CM | POA: Diagnosis not present

## 2023-11-11 DIAGNOSIS — S4990XA Unspecified injury of shoulder and upper arm, unspecified arm, initial encounter: Secondary | ICD-10-CM | POA: Diagnosis not present

## 2023-11-11 DIAGNOSIS — S271XXA Traumatic hemothorax, initial encounter: Secondary | ICD-10-CM | POA: Diagnosis not present

## 2023-11-11 DIAGNOSIS — D181 Lymphangioma, any site: Secondary | ICD-10-CM | POA: Diagnosis present

## 2023-11-11 DIAGNOSIS — M47813 Spondylosis without myelopathy or radiculopathy, cervicothoracic region: Secondary | ICD-10-CM | POA: Diagnosis not present

## 2023-11-11 DIAGNOSIS — G20A1 Parkinson's disease without dyskinesia, without mention of fluctuations: Secondary | ICD-10-CM | POA: Diagnosis not present

## 2023-11-11 DIAGNOSIS — R0782 Intercostal pain: Secondary | ICD-10-CM | POA: Diagnosis not present

## 2023-11-11 DIAGNOSIS — M25512 Pain in left shoulder: Secondary | ICD-10-CM | POA: Diagnosis not present

## 2023-11-11 DIAGNOSIS — W010XXA Fall on same level from slipping, tripping and stumbling without subsequent striking against object, initial encounter: Secondary | ICD-10-CM | POA: Diagnosis present

## 2023-11-11 DIAGNOSIS — K4090 Unilateral inguinal hernia, without obstruction or gangrene, not specified as recurrent: Secondary | ICD-10-CM | POA: Diagnosis present

## 2023-11-11 DIAGNOSIS — S3991XA Unspecified injury of abdomen, initial encounter: Secondary | ICD-10-CM | POA: Diagnosis not present

## 2023-11-11 DIAGNOSIS — M19012 Primary osteoarthritis, left shoulder: Secondary | ICD-10-CM | POA: Diagnosis not present

## 2023-11-11 DIAGNOSIS — I10 Essential (primary) hypertension: Secondary | ICD-10-CM | POA: Diagnosis present

## 2023-11-11 DIAGNOSIS — W1830XA Fall on same level, unspecified, initial encounter: Secondary | ICD-10-CM | POA: Diagnosis present

## 2023-11-11 DIAGNOSIS — M4312 Spondylolisthesis, cervical region: Secondary | ICD-10-CM | POA: Diagnosis not present

## 2023-11-11 DIAGNOSIS — R0789 Other chest pain: Secondary | ICD-10-CM | POA: Diagnosis not present

## 2023-11-11 DIAGNOSIS — M6281 Muscle weakness (generalized): Secondary | ICD-10-CM | POA: Diagnosis not present

## 2023-11-11 DIAGNOSIS — R1032 Left lower quadrant pain: Secondary | ICD-10-CM | POA: Diagnosis not present

## 2023-11-11 DIAGNOSIS — E785 Hyperlipidemia, unspecified: Secondary | ICD-10-CM | POA: Diagnosis not present

## 2023-11-11 DIAGNOSIS — I517 Cardiomegaly: Secondary | ICD-10-CM | POA: Diagnosis not present

## 2023-11-11 DIAGNOSIS — J90 Pleural effusion, not elsewhere classified: Secondary | ICD-10-CM | POA: Diagnosis not present

## 2023-11-11 DIAGNOSIS — S065XAA Traumatic subdural hemorrhage with loss of consciousness status unknown, initial encounter: Secondary | ICD-10-CM | POA: Diagnosis not present

## 2023-11-11 DIAGNOSIS — I498 Other specified cardiac arrhythmias: Secondary | ICD-10-CM | POA: Diagnosis present

## 2023-11-11 DIAGNOSIS — S42192A Fracture of other part of scapula, left shoulder, initial encounter for closed fracture: Secondary | ICD-10-CM | POA: Diagnosis not present

## 2023-11-11 DIAGNOSIS — S2232XA Fracture of one rib, left side, initial encounter for closed fracture: Secondary | ICD-10-CM | POA: Diagnosis not present

## 2023-11-11 DIAGNOSIS — Z23 Encounter for immunization: Secondary | ICD-10-CM | POA: Diagnosis not present

## 2023-11-11 DIAGNOSIS — Z8249 Family history of ischemic heart disease and other diseases of the circulatory system: Secondary | ICD-10-CM

## 2023-11-11 DIAGNOSIS — I700 Atherosclerosis of aorta: Secondary | ICD-10-CM | POA: Diagnosis not present

## 2023-11-11 DIAGNOSIS — I6523 Occlusion and stenosis of bilateral carotid arteries: Secondary | ICD-10-CM | POA: Diagnosis not present

## 2023-11-11 DIAGNOSIS — S42102D Fracture of unspecified part of scapula, left shoulder, subsequent encounter for fracture with routine healing: Secondary | ICD-10-CM | POA: Diagnosis not present

## 2023-11-11 DIAGNOSIS — E038 Other specified hypothyroidism: Secondary | ICD-10-CM | POA: Diagnosis not present

## 2023-11-11 DIAGNOSIS — S0990XA Unspecified injury of head, initial encounter: Secondary | ICD-10-CM | POA: Diagnosis not present

## 2023-11-11 DIAGNOSIS — G9609 Other spinal cerebrospinal fluid leak: Secondary | ICD-10-CM | POA: Diagnosis not present

## 2023-11-11 DIAGNOSIS — S42102A Fracture of unspecified part of scapula, left shoulder, initial encounter for closed fracture: Secondary | ICD-10-CM | POA: Diagnosis not present

## 2023-11-11 DIAGNOSIS — Z7401 Bed confinement status: Secondary | ICD-10-CM | POA: Diagnosis not present

## 2023-11-11 DIAGNOSIS — R41841 Cognitive communication deficit: Secondary | ICD-10-CM | POA: Diagnosis not present

## 2023-11-11 DIAGNOSIS — S199XXA Unspecified injury of neck, initial encounter: Secondary | ICD-10-CM | POA: Diagnosis not present

## 2023-11-11 DIAGNOSIS — S2242XD Multiple fractures of ribs, left side, subsequent encounter for fracture with routine healing: Secondary | ICD-10-CM | POA: Diagnosis not present

## 2023-11-11 DIAGNOSIS — I6200 Nontraumatic subdural hemorrhage, unspecified: Secondary | ICD-10-CM | POA: Diagnosis not present

## 2023-11-11 HISTORY — DX: Parkinson's disease without dyskinesia, without mention of fluctuations: G20.A1

## 2023-11-11 LAB — CBC
HCT: 43 % (ref 36.0–46.0)
Hemoglobin: 14.2 g/dL (ref 12.0–15.0)
MCH: 30.5 pg (ref 26.0–34.0)
MCHC: 33 g/dL (ref 30.0–36.0)
MCV: 92.3 fL (ref 80.0–100.0)
Platelets: 259 K/uL (ref 150–400)
RBC: 4.66 MIL/uL (ref 3.87–5.11)
RDW: 13.1 % (ref 11.5–15.5)
WBC: 12.3 K/uL — ABNORMAL HIGH (ref 4.0–10.5)
nRBC: 0 % (ref 0.0–0.2)

## 2023-11-11 LAB — COMPREHENSIVE METABOLIC PANEL WITH GFR
ALT: 8 U/L (ref 0–44)
AST: 19 U/L (ref 15–41)
Albumin: 3.7 g/dL (ref 3.5–5.0)
Alkaline Phosphatase: 109 U/L (ref 38–126)
Anion gap: 8 (ref 5–15)
BUN: 16 mg/dL (ref 8–23)
CO2: 22 mmol/L (ref 22–32)
Calcium: 8.9 mg/dL (ref 8.9–10.3)
Chloride: 107 mmol/L (ref 98–111)
Creatinine, Ser: 0.92 mg/dL (ref 0.44–1.00)
GFR, Estimated: >60 mL/min (ref >60)
Glucose, Bld: 111 mg/dL — ABNORMAL HIGH (ref 70–99)
Potassium: 3.5 mmol/L (ref 3.5–5.1)
Sodium: 137 mmol/L (ref 135–145)
Total Bilirubin: 0.7 mg/dL (ref 0.0–1.2)
Total Protein: 6.5 g/dL (ref 6.5–8.1)

## 2023-11-11 MED ORDER — MORPHINE SULFATE (PF) 2 MG/ML IV SOLN
2.0000 mg | INTRAVENOUS | Status: DC | PRN
  Administered 2023-11-12 – 2023-11-13 (×4): 2 mg via INTRAVENOUS
  Filled 2023-11-11 (×4): qty 1

## 2023-11-11 MED ORDER — METOPROLOL TARTRATE 5 MG/5ML IV SOLN
5.0000 mg | Freq: Four times a day (QID) | INTRAVENOUS | Status: DC | PRN
  Administered 2023-11-12: 5 mg via INTRAVENOUS
  Filled 2023-11-11 (×2): qty 5

## 2023-11-11 MED ORDER — MELATONIN 3 MG PO TABS
3.0000 mg | ORAL_TABLET | Freq: Every evening | ORAL | Status: DC | PRN
  Administered 2023-11-13 – 2023-11-16 (×4): 3 mg via ORAL
  Filled 2023-11-11 (×4): qty 1

## 2023-11-11 MED ORDER — POLYETHYLENE GLYCOL 3350 17 G PO PACK
17.0000 g | PACK | Freq: Every day | ORAL | Status: DC
  Administered 2023-11-12 – 2023-11-16 (×5): 17 g via ORAL
  Filled 2023-11-11 (×6): qty 1

## 2023-11-11 MED ORDER — OXYCODONE HCL 5 MG PO TABS: 5.0000 mg | ORAL_TABLET | ORAL | Status: DC | PRN

## 2023-11-11 MED ORDER — ENOXAPARIN SODIUM 30 MG/0.3ML IJ SOSY
30.0000 mg | PREFILLED_SYRINGE | Freq: Two times a day (BID) | INTRAMUSCULAR | Status: DC
  Administered 2023-11-13 – 2023-11-17 (×9): 30 mg via SUBCUTANEOUS
  Filled 2023-11-11 (×9): qty 0.3

## 2023-11-11 MED ORDER — OXYCODONE-ACETAMINOPHEN 5-325 MG PO TABS
1.0000 | ORAL_TABLET | Freq: Once | ORAL | Status: AC
  Administered 2023-11-11: 1 via ORAL
  Filled 2023-11-11: qty 1

## 2023-11-11 MED ORDER — LACTATED RINGERS IV SOLN: INTRAVENOUS | Status: DC

## 2023-11-11 MED ORDER — ACETAMINOPHEN 500 MG PO TABS
1000.0000 mg | ORAL_TABLET | Freq: Four times a day (QID) | ORAL | Status: DC
  Administered 2023-11-11 – 2023-11-17 (×22): 1000 mg via ORAL
  Filled 2023-11-11 (×24): qty 2

## 2023-11-11 MED ORDER — LEVOTHYROXINE SODIUM 100 MCG PO TABS
100.0000 ug | ORAL_TABLET | Freq: Every morning | ORAL | Status: DC
  Administered 2023-11-12 – 2023-11-17 (×6): 100 ug via ORAL
  Filled 2023-11-11 (×6): qty 1

## 2023-11-11 MED ORDER — FENTANYL CITRATE (PF) 100 MCG/2ML IJ SOLN
50.0000 ug | Freq: Once | INTRAMUSCULAR | Status: AC
  Administered 2023-11-11: 50 ug via INTRAVENOUS
  Filled 2023-11-11: qty 2

## 2023-11-11 MED ORDER — ONDANSETRON HCL 4 MG/2ML IJ SOLN
4.0000 mg | Freq: Four times a day (QID) | INTRAMUSCULAR | Status: DC | PRN
  Administered 2023-11-12 – 2023-11-15 (×4): 4 mg via INTRAVENOUS
  Filled 2023-11-11 (×5): qty 2

## 2023-11-11 MED ORDER — ONDANSETRON 4 MG PO TBDP
4.0000 mg | ORAL_TABLET | Freq: Four times a day (QID) | ORAL | Status: DC | PRN
  Administered 2023-11-13 (×2): 4 mg via ORAL
  Filled 2023-11-11 (×2): qty 1

## 2023-11-11 MED ORDER — METHOCARBAMOL 1000 MG/10ML IJ SOLN
500.0000 mg | Freq: Three times a day (TID) | INTRAMUSCULAR | Status: AC
  Administered 2023-11-13: 500 mg via INTRAVENOUS
  Filled 2023-11-11: qty 10

## 2023-11-11 MED ORDER — HYDROCHLOROTHIAZIDE 25 MG PO TABS
25.0000 mg | ORAL_TABLET | Freq: Every day | ORAL | Status: DC
  Administered 2023-11-11 – 2023-11-17 (×7): 25 mg via ORAL
  Filled 2023-11-11 (×8): qty 1

## 2023-11-11 MED ORDER — OXYCODONE HCL 5 MG PO TABS
10.0000 mg | ORAL_TABLET | ORAL | Status: DC | PRN
  Administered 2023-11-11 – 2023-11-12 (×4): 10 mg via ORAL
  Filled 2023-11-11 (×4): qty 2

## 2023-11-11 MED ORDER — METHOCARBAMOL 500 MG PO TABS
500.0000 mg | ORAL_TABLET | Freq: Three times a day (TID) | ORAL | Status: AC
  Administered 2023-11-11 – 2023-11-14 (×7): 500 mg via ORAL
  Filled 2023-11-11 (×8): qty 1

## 2023-11-11 MED ORDER — LOSARTAN POTASSIUM 50 MG PO TABS
100.0000 mg | ORAL_TABLET | Freq: Every day | ORAL | Status: DC
  Administered 2023-11-11 – 2023-11-17 (×7): 100 mg via ORAL
  Filled 2023-11-11 (×8): qty 2

## 2023-11-11 MED ORDER — HYDRALAZINE HCL 20 MG/ML IJ SOLN: 10.0000 mg | INTRAMUSCULAR | Status: DC | PRN

## 2023-11-11 MED ORDER — CARBIDOPA-LEVODOPA 25-100 MG PO TABS
1.0000 | ORAL_TABLET | Freq: Three times a day (TID) | ORAL | Status: DC
  Administered 2023-11-11 – 2023-11-17 (×14): 1 via ORAL
  Filled 2023-11-11 (×19): qty 1

## 2023-11-11 MED ORDER — IOHEXOL 300 MG/ML  SOLN
100.0000 mL | Freq: Once | INTRAMUSCULAR | Status: AC | PRN
  Administered 2023-11-11: 100 mL via INTRAVENOUS

## 2023-11-11 MED ORDER — DOCUSATE SODIUM 100 MG PO CAPS
100.0000 mg | ORAL_CAPSULE | Freq: Two times a day (BID) | ORAL | Status: DC
  Administered 2023-11-11 – 2023-11-17 (×12): 100 mg via ORAL
  Filled 2023-11-11 (×13): qty 1

## 2023-11-11 NOTE — ED Notes (Signed)
 Assisted patient with bedpan

## 2023-11-11 NOTE — ED Triage Notes (Addendum)
 Pt bib family after she was found in the floor at home. Pt c/o pain to L shoulder, L rib area, and back area. Denies LOC or hitting head. Not on blood thinners per family.

## 2023-11-11 NOTE — H&P (Signed)
 Amber Rosales Sep 10, 1940  [id number].    Chief Complaint/Reason for Consult: Fall  HPI:  This is an 83 yo white female with a history of HLD, HTN, hypothyroidism, Anxiety, and Parkinson's disease who lost her balance getting up to the bathroom and tripped and fell last night.  She fell onto her left side onto a table and has pain in her left posterior chest/shoulder.  She did not hit her head.  She presented to [facility] for evaluation.  She underwent trauma scans and was found to have enlarging and somewhat asymmetrical extra-axial CSF density collections with mass effect including slight leftward midline shift suggesting the possibility of an acute traumatic subdural hygroma, L 3-7 rib fx, L comminuted scapular fx, and incidental findings of a L inguinal and umbilical hernia.  She has been transferred to [facility] for trauma service admission and consultation with ortho and NSGY specialist.  ROS: ROS: see HPI, otherwise admits to chronic LE edema in her ankles.  She has stopped her hydrochlorothiazide  as it makes her void too much.  She admits to some frequent falls due to imbalance.  She is supposed to use a walker but does not.  Denies any previous intracranial issue.  Family History  Problem Relation Age of Onset   Hypertension Mother    Cancer Mother    CAD Father    Heart failure Sister    Heart failure Brother     Past Medical History:  Diagnosis Date   Anxiety    Hyperlipidemia    Hypertension    Hypothyroidism    Parkinson's disease (HCC)    Tremor    Vertigo     Past Surgical History:  Procedure Laterality Date   DILATION AND CURETTAGE OF UTERUS     WISDOM TOOTH EXTRACTION      Social History:  reports that she quit smoking about 65 years ago. Her smoking use included cigarettes. She started smoking about 75 years ago. She has never used smokeless tobacco. She reports that she does not drink alcohol  and does not use drugs.  Allergies: No Known Allergies  (Not in a  hospital admission)    Physical Exam: Blood pressure (!) 142/83, pulse 90, temperature 97.7 F (36.5 C), temperature source Oral, resp. rate 18, height 4' 11 (1.499 m), weight 59 kg, SpO2 93%. General: pleasant, WD, WN, elderly white female who is laying in bed in NAD HEENT: head is normocephalic, atraumatic.  Sclera are noninjected.  PERRL.  Ears and nose without any masses or lesions.  Mouth is pink and moist.  Neck with expected ROM for patient in her 80s.  No pain with ROM or midline tenderness to palpation Heart: regular, rate, and rhythm.  Normal s1,s2. No obvious murmurs, gallops, or rubs noted.  Palpable radial and pedal pulses bilaterally Lungs: CTAB, no wheezes, rhonchi, or rales noted.  Respiratory effort nonlabored.  Chest wall tenderness posteriorly, small abrasion on the left chest/flank area, but no ecchymosis. Abd: soft, NT, ND, +BS, no masses, hernias, or organomegaly MS: all 4 extremities are symmetrical with no cyanosis, clubbing.  She does have B ankle and LE pitting edema.  She also has limited ROM of her LUE due to pain in her scapula.  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  Benign tremor of UEs Skin: warm and dry with no masses, lesions, or rashes Neuro: Cranial nerves 2-12 grossly intact, sensation is normal throughout Psych:  A&Ox3 with an appropriate affect.   Results for orders placed or performed during the hospital encounter of 11/11/23 (from the past 48 hours)  CBC     Status: Abnormal   Collection Time: 11/11/23  5:58 AM  Result Value Ref Range   WBC 12.3 (H) 4.0 - 10.5 K/uL   RBC 4.66 3.87 - 5.11 MIL/uL   Hemoglobin 14.2 12.0 - 15.0 g/dL   HCT 56.9 63.9 - 53.9 %   MCV 92.3 80.0 - 100.0 fL   MCH 30.5 26.0 - 34.0 pg   MCHC 33.0 30.0 - 36.0 g/dL   RDW 86.8 88.4 - 84.4 %   Platelets 259 150 - 400 K/uL   nRBC 0.0 0.0 - 0.2 %    Comment: Performed at [facility], [address]  [place], [place] [address]  Comprehensive metabolic panel with GFR     Status: Abnormal   Collection Time: 11/11/23  5:58 AM  Result Value Ref Range   Sodium 137 135 - 145 mmol/L   Potassium 3.5 3.5 - 5.1 mmol/L   Chloride 107 98 - 111 mmol/L   CO2 22 22 - 32 mmol/L   Glucose, Bld 111 (H) 70 - 99 mg/dL    Comment: Glucose reference range applies only to samples taken after fasting for at least 8 hours.   BUN 16 8 - 23 mg/dL   Creatinine, Ser 9.07 0.44 - 1.00 mg/dL   Calcium  8.9 8.9 - 10.3 mg/dL   Total Protein 6.5 6.5 - 8.1 g/dL   Albumin 3.7 3.5 - 5.0 g/dL   AST 19 15 - 41 U/L   ALT 8 0 - 44 U/L   Alkaline Phosphatase 109 38 - 126 U/L   Total Bilirubin 0.7 0.0 - 1.2 mg/dL   GFR, Estimated >39 >39 mL/min    Comment: (NOTE) Calculated using the CKD-EPI Creatinine Equation (2021)    Anion gap 8 5 - 15    Comment: Performed at [facility], [address], [place], [place] [address]   CT CHEST ABDOMEN PELVIS W CONTRAST Result Date: 11/11/2023 EXAM: CT CHEST, ABDOMEN AND PELVIS WITH CONTRAST 11/11/2023 07:04:51 AM TECHNIQUE: CT of the chest, abdomen and pelvis was performed with the administration of intravenous contrast. Multiplanar reformatted images are provided for review. Automated exposure control, iterative reconstruction, and/or weight based adjustment of the mA/kV was utilized to reduce the radiation dose to as low as reasonably achievable. COMPARISON: CT angio chest 12/18/2013 CLINICAL HISTORY: Polytrauma, blunt. Pt bib family after she was found in the floor at home. Pt c/o pain to L shoulder, L rib area, and back area. Denies LOC or hitting head. Not on blood thinners per family. FINDINGS: CHEST: MEDIASTINUM: The heart is mildly enlarged. A locule of air anterior to the pericardium on the left appears to be intraperitoneal. This could represent additional pneumothorax with a slip of pleura extending anterior to the heart. THORACIC LYMPH NODES: No mediastinal, hilar or axillary  lymphadenopathy. LUNGS AND PLEURA: Asymmetric dependent atelectasis is present at the left base. A tiny pneumothorax is present on image 23 of series 3. Left sixth and seventh posterolateral displaced rib fractures are present. Adjacent pleural effusions/hemothorax is present. ABDOMEN AND PELVIS: LIVER: The liver is unremarkable. GALLBLADDER AND BILE DUCTS: Gallbladder is unremarkable. No biliary ductal dilatation. SPLEEN: No acute abnormality. PANCREAS: No acute abnormality. ADRENAL GLANDS: No acute abnormality. KIDNEYS, URETERS AND BLADDER: No stones in the kidneys or ureters. No hydronephrosis. No perinephric or periureteral stranding. Urinary bladder is  unremarkable. GI AND BOWEL: Stomach demonstrates no acute abnormality. There is no bowel obstruction. Diverticular changes are present in the sigmoid and descending colon without focal inflammation to suggest diverticulitis. REPRODUCTIVE ORGANS: A vaginal pessary is in place. PERITONEUM AND RETROPERITONEUM: No ascites. No free air. VASCULATURE: Atherosclerotic calcifications are present in the aorta and branch vessels. No aneurysm is present. ABDOMINAL AND PELVIS LYMPH NODES: No lymphadenopathy. REPRODUCTIVE ORGANS: No acute abnormality. BONES AND SOFT TISSUES: A comminuted left scapular fracture is present. The glenohumeral joint is intact. Chronic rightward curvature of the thoracic spine is present at T7-8. Leftward curvature in the lumbar spine is centered at L3. Fat herniates into the left inguinal canal. A periumbilical hernia contains fat without bowel. IMPRESSION: 1. Left sixth and seventh posterolateral displaced rib fractures with adjacent pleural effusions/hemothorax and tiny pneumothorax. 2. Possible pneumoperitoneum versus additional pleural air anterior to the pericardium. 3. Comminuted left scapular fracture with intact glenohumeral joint. 4. Chronic rightward curvature of the thoracic spine at T7-8 and leftward curvature in the lumbar spine at L3.  5. Fat herniation into the left inguinal canal and periumbilical hernia containing fat without bowel. Critical findings were called to Dr. Zammit at 07:34 am. Electronically signed by: Lonni Necessary MD 11/11/2023 07:39 AM EDT RP Workstation: [id number]   CT CERVICAL SPINE WO CONTRAST Result Date: 11/11/2023 EXAM: CT CERVICAL SPINE WITHOUT CONTRAST 11/11/2023 07:04:51 AM TECHNIQUE: CT of the cervical spine was performed without the administration of intravenous contrast. Multiplanar reformatted images are provided for review. Automated exposure control, iterative reconstruction, and/or weight based adjustment of the mA/kV was utilized to reduce the radiation dose to as low as reasonably achievable. COMPARISON: None available. CLINICAL HISTORY: Neck trauma (Age >= 65y). Pt bib family after she was found in the floor at home. Pt c/o pain to L shoulder, L rib area, and back area. Denies LOC or hitting head. Not on blood thinners per family. FINDINGS: CERVICAL SPINE: BONES AND ALIGNMENT: Straightening of the normal cervical lordosis is present. Slight degenerative anterolisthesis is present at C3-4. DEGENERATIVE CHANGES: Chronic loss of disc height is present with uncovertebral spurring at C4-5, C5-6, C6-7 and C7-T1. SOFT TISSUES: No prevertebral soft tissue swelling. IMPRESSION: 1. No acute abnormality of the cervical spine related to the reported neck trauma. 2. Straightening of the normal cervical lordosis. 3. Slight degenerative anterolisthesis at C3-4. 4. Chronic loss of disc height with uncovertebral spurring at C4-5, C5-6, C6-7, and C7-T1. Electronically signed by: Lonni Necessary MD 11/11/2023 07:21 AM EDT RP Workstation: [id number]   CT HEAD WO CONTRAST ( ) Result Date: 11/11/2023 EXAM: CT HEAD WITHOUT CONTRAST 11/11/2023 07:04:51 AM TECHNIQUE: CT of the head was performed without the administration of intravenous contrast. Automated exposure control, iterative reconstruction, and/or weight  based adjustment of the mA/kV was utilized to reduce the radiation dose to as low as reasonably achievable. COMPARISON: 04/03/2016 CLINICAL HISTORY: Head trauma, moderate-severe. Pt bib family after she was found in the floor at home. Pt c/o pain to L shoulder, L rib area, and back area. Denies LOC or hitting head. Not on blood thinners per family. FINDINGS: BRAIN AND VENTRICLES: No acute infarct, parenchymal hemorrhage or mass lesion is present. Enlarging and somewhat asymmetrical extra-axial CSF density collections are present anteriorly. There is some mass effect with slight leftward midline shift and displacement of the anterior left frontal lobe away from the intracerebral falx. ORBITS: No acute abnormality. SINUSES: No acute abnormality. SOFT TISSUES AND SKULL: No acute soft tissue abnormality. No skull fracture. VASCULATURE: Atherosclerotic  calcifications are present in the cavernous carotid arteries bilaterally and at the dural margin of both vertebral arteries. No hyperdense vessel is present. IMPRESSION: 1. Enlarging and somewhat asymmetrical extra-axial CSF density collections anteriorly, with mass effect including slight leftward midline shift and displacement of the anterior left frontal lobe away from the intracerebral falx. This suggests the possibility of an acute traumatic subdural hygroma. 2. No acute infarct, parenchymal hemorrhage, or mass lesion. Electronically signed by: Lonni Necessary MD 11/11/2023 07:19 AM EDT RP Workstation: [id number]   DG Chest Port 1 View Result Date: 11/11/2023 EXAM: 1 VIEW XRAY OF THE CHEST 11/11/2023 06:06:00 AM COMPARISON: 06/17/2016 CLINICAL HISTORY: Fall earlier this morning onto left side region regarding left shoulder pain. FINDINGS: LUNGS AND PLEURA: Mild atelectasis and possible small left effusion are noted. The right lung is clear. No pneumothorax. HEART AND MEDIASTINUM: No acute abnormality of the cardiac and mediastinal silhouettes. BONES AND SOFT  TISSUES: Acute left third, fourth and fifth rib fractures are present. IMPRESSION: 1. Acute left third, fourth, and fifth rib fractures. 2. Mild atelectasis and possible small left effusion. No pneumothorax. Electronically signed by: Lonni Necessary MD 11/11/2023 07:03 AM EDT RP Workstation: [id number]   DG Shoulder Left Portable Result Date: 11/11/2023 EXAM: 1 VIEW XRAY OF THE LEFT SHOULDER 11/11/2023 06:04:00 AM COMPARISON: None available. CLINICAL HISTORY: Fall. Fell earlier this morning onto left side region regarding left shoulder pain. FINDINGS: BONES AND JOINTS: Degenerative changes are present in the left shoulder. The left shoulder is located. Left third, fourth and fifth rib fractures are noted. No acute fracture or dislocation of the left shoulder. SOFT TISSUES: No pneumothorax is present. Atherosclerotic changes are present at the aortic arch. IMPRESSION: 1. Left third, fourth, and fifth rib fractures. No pneumothorax. 2. Degenerative changes in the left shoulder. No acute osseous abnormality of the left shoulder. Electronically signed by: Lonni Necessary MD 11/11/2023 07:00 AM EDT RP Workstation: [id number]      Assessment/Plan Fall Hygroma - some increase in size per radiology, but I can't find any other head CT that shows this abnormality.  Last CT in PACs is from 2017.  NSGY to see to determine if this requires any treatment.  Prelim is follow up head CT in 3 months with no intervention.  Dr. Debby to see L rib fxs 3-7 with tiny HPTX - pain control, IS, pulm toilet, PT/OT, repeat CXR in am L comminuted scapula fx - per ortho, will see in am HTN - resume home med as well as hydrochlorothiazide  for LE edema Parkinson's disease - resume home med Hypothyroidism - resume home med HLD FEN - regular diet, LR at 50cc but DC if oral intake good tomorrow due to LE edema VTE - hold Lovenox  for official NSGY eval, if ok will start tomorrow ID - none Admit - inpatient, therapies,  progress  I reviewed nursing notes, ED provider notes, last 24 h vitals and pain scores, last 48 h intake and output, last 24 h labs and trends, and last 24 h imaging results.  Burnard FORBES Banter, [facility] 11/11/2023, 11:06 AM Please see Amion for pager number during day hours 7:00am-4:30pm or 7:00am -11:30am on weekends

## 2023-11-11 NOTE — ED Notes (Signed)
 Patient to CT.

## 2023-11-11 NOTE — ED Notes (Signed)
 Pt repositioned with pillow under left arm and under her back. States she is going to try and take a nap.

## 2023-11-11 NOTE — Plan of Care (Signed)

## 2023-11-11 NOTE — Progress Notes (Signed)
 Transition of Care [facility]) - CAGE-AID Screening   Patient Details  Name: Amber Rosales MRN: [id number] Date of Birth: 1940-06-11   [name], RN Trauma Response Nurse Phone Number: [phone] 11/11/2023, 6:34 PM   CAGE-AID Screening:    Have You Ever Felt You Ought to Cut Down on Your Drinking or Drug Use?: No Have People Annoyed You By Critizing Your Drinking Or Drug Use?: No Have You Felt Bad Or Guilty About Your Drinking Or Drug Use?: No Have You Ever Had a Drink or Used Drugs First Thing In The Morning to Steady Your Nerves or to Get Rid of a Hangover?: No CAGE-AID Score: 0  Substance Abuse Education Offered: (S) No (declines services- does not drink or smoke)

## 2023-11-11 NOTE — Consult Note (Incomplete)
 HPI:     Patient is a 83 y.o. female w/ PMH of HLD, HTN, hypothyroidism, Anxiety, and PD who presented to [facility] ED after a fall last night. Transferred to [facility] for further mgmt of L rib fractures, scapula fx, as well as for potentially enlarging hygroma for which NSGY was consulted. Currently c/o L chest pain and L shoulder pain. No recent or increasing acute HA, mental status change, speech difficulty, visual change, UE/LE symptoms, facial change.     Patient Active Problem List   Diagnosis Date Noted   Fall 11/11/2023   PSVT (paroxysmal supraventricular tachycardia) (HCC) 12/18/2013   Paroxysmal cardiac arrhythmia 12/16/2013   Heart palpitations 12/16/2013   HTN (hypertension) 12/16/2013   Hyperlipidemia 12/16/2013   Past Medical History:  Diagnosis Date   Anxiety    Hyperlipidemia    Hypertension    Hypothyroidism    Parkinson's disease (HCC)    Tremor    Vertigo     Past Surgical History:  Procedure Laterality Date   DILATION AND CURETTAGE OF UTERUS     WISDOM TOOTH EXTRACTION      (Not in a hospital admission)  No Known Allergies  Social History   Tobacco Use   Smoking status: Former    Current packs/day: 0.00    Types: Cigarettes    Start date: 12/17/1947    Quit date: 12/16/1957    Years since quitting: 65.9   Smokeless tobacco: Never  Substance Use Topics   Alcohol  use: No    Family History  Problem Relation Age of Onset   Hypertension Mother    Cancer Mother    CAD Father    Heart failure Sister    Heart failure Brother    Objective:   Patient Vitals for the past 8 hrs:  BP Temp Temp src Pulse Resp SpO2 Height Weight  11/11/23 1030 (!) 142/83 -- -- 90 18 93 % -- --  11/11/23 1000 (!) 160/83 -- -- 91 15 94 % -- --  11/11/23 0930 (!) 165/88 -- -- 90 (!) 22 93 % -- --  11/11/23 0915 -- -- -- 91 17 94 % -- --  11/11/23 0900 (!) 169/72 -- -- 92 14 90 % -- --  11/11/23 0845 -- -- -- 87 18 95 % -- --  11/11/23 0830 (!) 165/79 -- -- 89 17 94 % -- --   11/11/23 0800 (!) 163/90 -- -- 95 17 93 % -- --  11/11/23 0730 (!) 170/91 -- -- 91 19 94 % -- --  11/11/23 0630 (!) 173/77 -- -- 86 16 93 % -- --  11/11/23 0541 (!) 163/88 97.7 F (36.5 C) Oral 93 20 97 % -- --  11/11/23 0539 -- -- -- -- -- -- 4' 11 (1.499 m) 59 kg   No intake/output data recorded. No intake/output data recorded.  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   Plan:   -No neurosurgical intervention indicated  Lyanna Blystone CAYLIN Goldie Tregoning, PA-C

## 2023-11-11 NOTE — ED Provider Notes (Signed)
 Patient with a fall.  She has 2 rib fractures with hemothorax and tiny pneumothorax.  Patient also has a comminuted scapular fracture and possible pneumoperitoneum.  CT of the head shows extra-axial CSF density collections anteriorly with mass effect including slight left third mid line shift.  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  I spoke with Dr. Dann Hummer and he excepted the patient to go to surgical Progressive floor   Suzette Pac, MD 11/11/23 [phone]

## 2023-11-11 NOTE — ED Notes (Signed)
 Transport at bedside

## 2023-11-11 NOTE — Progress Notes (Signed)
 Patient is a 83 y.o. female w/ PMH of HLD, HTN, hypothyroidism, anxiety, and PD who presented to [facility] ED after a fall last night. Transferred to [facility] for further mgmt of L rib fractures, scapula fx, as well as for potentially enlarging hygroma for which NSGY was consulted. Upon review of imaging, there is b/l R>L hygroma vs SDH which has mildly enlarged compared to imaging in 2017. Recommend repeat CTH in 54m, no acute intervention indicated.   Amber Rosales Amber Nyxon Strupp, PA-C

## 2023-11-11 NOTE — ED Provider Notes (Signed)
 [facility]-EMERGENCY DEPT [facility] Emergency Department Provider Note MRN:  [id number]  Arrival date & time: 11/11/23     Chief Complaint   Fall   History of Present Illness   Amber Rosales is a 83 y.o. year-old female with a history of Parkinson's disease presenting to the ED with chief complaint of fall.  Lost her balance, tripped and fell endorsing left-sided shoulder pain, left flank pain, left rib pain.  Unsure if she hit her head.  Endorsing lower back pain as well.  Review of Systems  A thorough review of systems was obtained and all systems are negative except as noted in the HPI and PMH.   Patient's Health History    Past Medical History:  Diagnosis Date   Anxiety    Hyperlipidemia    Hypertension    Hypothyroidism    Parkinson's disease (HCC)    Tremor    Vertigo     Past Surgical History:  Procedure Laterality Date   DILATION AND CURETTAGE OF UTERUS     WISDOM TOOTH EXTRACTION      Family History  Problem Relation Age of Onset   Hypertension Mother    Cancer Mother    CAD Father    Heart failure Sister    Heart failure Brother     Social History   Socioeconomic History   Marital status: Married    Spouse name: Not on file   Number of children: Not on file   Years of education: Not on file   Highest education level: Not on file  Occupational History   Not on file  Tobacco Use   Smoking status: Former    Current packs/day: 0.00    Types: Cigarettes    Start date: 12/17/1947    Quit date: 12/16/1957    Years since quitting: 65.9   Smokeless tobacco: Never  Vaping Use   Vaping status: Never Used  Substance and Sexual Activity   Alcohol  use: No   Drug use: No   Sexual activity: Not Currently    Birth control/protection: Post-menopausal  Other Topics Concern   Not on file  Social History Narrative   Lives with husband   Social Drivers of Health   Financial Resource Strain: Low Risk  (07/31/2021)   Overall Financial Resource Strain  (CARDIA)    Difficulty of Paying Living Expenses: Not hard at all  Food Insecurity: No Food Insecurity (07/31/2021)   Hunger Vital Sign    Worried About Running Out of Food in the Last Year: Never true    Ran Out of Food in the Last Year: Never true  Transportation Needs: No Transportation Needs (07/31/2021)   PRAPARE - Administrator, Civil Service (Medical): No    Lack of Transportation (Non-Medical): No  Physical Activity: Inactive (07/31/2021)   Exercise Vital Sign    Days of Exercise per Week: 0 days    Minutes of Exercise per Session: 0 min  Stress: No Stress Concern Present (07/31/2021)   [name]    Feeling of Stress : Only a little  Social Connections: Socially Integrated (07/31/2021)   Social Connection and Isolation Panel    Frequency of Communication with Friends and Family: More than three times a week    Frequency of Social Gatherings with Friends and Family: Twice a week    Attends Religious Services: More than 4 times per year    Active Member of [employer] or Organizations: Yes  Attends Banker Meetings: More than 4 times per year    Marital Status: Married  Catering manager Violence: Not At Risk (07/31/2021)   Humiliation, Afraid, Rape, and Kick questionnaire    Fear of Current or Ex-Partner: No    Emotionally Abused: No    Physically Abused: No    Sexually Abused: No     Physical Exam   Vitals:   11/11/23 0541 11/11/23 0630  BP: (!) 163/88 (!) 173/77  Pulse: 93 86  Resp: 20 16  Temp: 97.7 F (36.5 C)   SpO2: 97% 93%    CONSTITUTIONAL: Well-appearing, NAD NEURO/PSYCH:  Alert and oriented x 3, no focal deficits EYES:  eyes equal and reactive ENT/NECK:  no LAD, no JVD CARDIO: Regular rate, well-perfused, normal S1 and S2 PULM:  CTAB no wheezing or rhonchi GI/GU:  non-distended, non-tender MSK/SPINE:  No gross deformities, no edema SKIN:  no rash,  atraumatic   *Additional and/or pertinent findings included in MDM below  Diagnostic and Interventional Summary    EKG Interpretation Date/Time:  Thursday November 11 2023 05:44:25 EDT Ventricular Rate:  86 PR Interval:  56 QRS Duration:  115 QT Interval:  337 QTC Calculation: 401 R Axis:   -31  Text Interpretation: Sinus rhythm Short PR interval Probable left atrial enlargement Incomplete right bundle branch block Left ventricular hypertrophy Confirmed by Theadore Sharper [phone]) on 11/11/2023 6:59:10 AM       Labs Reviewed  CBC - Abnormal; Notable for the following components:      Result Value   WBC 12.3 (*)    All other components within normal limits  COMPREHENSIVE METABOLIC PANEL WITH GFR - Abnormal; Notable for the following components:   Glucose, Bld 111 (*)    All other components within normal limits    CT HEAD WO CONTRAST ( )    (Results Pending)  CT CERVICAL SPINE WO CONTRAST    (Results Pending)  CT CHEST ABDOMEN PELVIS W CONTRAST    (Results Pending)  DG Chest Port 1 View    (Results Pending)  DG Shoulder Left Portable    (Results Pending)    Medications  fentaNYL  (SUBLIMAZE ) injection 50 mcg (50 mcg Intravenous Given 11/11/23 0611)  iohexol  (OMNIPAQUE ) 300 MG/ML solution 100 mL (100 mLs Intravenous Contrast Given 11/11/23 0650)     Procedures  /  Critical Care Procedures  ED Course and Medical Decision Making  Initial Impression and Ddx Differential diagnosis includes rib fractures, blunt trauma to the lungs, kidney, spleen.  Past medical/surgical history that increases complexity of ED encounter: Parkinson's  Interpretation of Diagnostics I personally reviewed the Chest Xray and my interpretation is as follows: No pneumothorax  CT imaging pending  Patient Reassessment and Ultimate Disposition/Management     Signed out to oncoming provider at shift change.  Patient management required discussion with the following services or consulting groups:   None  Complexity of Problems Addressed Acute illness or injury that poses threat of life of bodily function  Additional Data Reviewed and Analyzed Further history obtained from: Further history from spouse/family member  Additional Factors Impacting ED Encounter Risk Use of parenteral controlled substances and Consideration of hospitalization  Sharper HERO. Theadore, MD [facility] Emergency Medicine [facility] [email or web address]  Final Clinical Impressions(s) / ED Diagnoses     ICD-10-CM   1. Fall, initial encounter  W19.[facility]       ED Discharge Orders     None        Discharge  Instructions Discussed with and Provided to Patient:   Discharge Instructions   None      Theadore Ozell HERO, MD 11/11/23 [phone]

## 2023-11-12 ENCOUNTER — Inpatient Hospital Stay (HOSPITAL_COMMUNITY)

## 2023-11-12 LAB — BASIC METABOLIC PANEL WITH GFR
Anion gap: 9 (ref 5–15)
BUN: 11 mg/dL (ref 8–23)
CO2: 25 mmol/L (ref 22–32)
Calcium: 9 mg/dL (ref 8.9–10.3)
Chloride: 102 mmol/L (ref 98–111)
Creatinine, Ser: 0.82 mg/dL (ref 0.44–1.00)
GFR, Estimated: >60 mL/min (ref >60)
Glucose, Bld: 100 mg/dL — ABNORMAL HIGH (ref 70–99)
Potassium: 3.7 mmol/L (ref 3.5–5.1)
Sodium: 136 mmol/L (ref 135–145)

## 2023-11-12 LAB — CBC
HCT: 38.7 % (ref 36.0–46.0)
Hemoglobin: 13.1 g/dL (ref 12.0–15.0)
MCH: 30.7 pg (ref 26.0–34.0)
MCHC: 33.9 g/dL (ref 30.0–36.0)
MCV: 90.6 fL (ref 80.0–100.0)
Platelets: 234 K/uL (ref 150–400)
RBC: 4.27 MIL/uL (ref 3.87–5.11)
RDW: 13.1 % (ref 11.5–15.5)
WBC: 7.5 K/uL (ref 4.0–10.5)
nRBC: 0 % (ref 0.0–0.2)

## 2023-11-12 MED ORDER — TRAMADOL HCL 50 MG PO TABS
100.0000 mg | ORAL_TABLET | Freq: Four times a day (QID) | ORAL | Status: DC | PRN
  Administered 2023-11-13 (×2): 100 mg via ORAL
  Filled 2023-11-12 (×2): qty 2

## 2023-11-12 MED ORDER — TRAMADOL HCL 50 MG PO TABS
50.0000 mg | ORAL_TABLET | Freq: Four times a day (QID) | ORAL | Status: DC | PRN
  Administered 2023-11-12 – 2023-11-15 (×6): 50 mg via ORAL
  Filled 2023-11-12 (×6): qty 1

## 2023-11-12 MED ORDER — PROCHLORPERAZINE EDISYLATE 10 MG/2ML IJ SOLN: 10.0000 mg | Freq: Four times a day (QID) | INTRAMUSCULAR | Status: DC | PRN

## 2023-11-12 MED ORDER — ENSURE PLUS HIGH PROTEIN PO LIQD
237.0000 mL | Freq: Two times a day (BID) | ORAL | Status: DC
  Administered 2023-11-12 – 2023-11-15 (×7): 237 mL via ORAL

## 2023-11-12 NOTE — NC FL2 (Signed)
 [place]  MEDICAID FL2 LEVEL OF CARE FORM     IDENTIFICATION  Patient Name: Amber Rosales Birthdate: October 02, 1940 Sex: female Admission Date (Current Location): 11/11/2023  [facility] and [place] Number:  Producer, television/film/video and Address:  [facility], [address] [place], [place] [address]      Provider Number: [id number]  Attending Physician Name and Address:  Md, Trauma, MD  Relative Name and Phone Number:  LAURITA ROOTS (Daughter)  [phone] [facility])    Current Level of Care: Hospital Recommended Level of Care: Skilled Nursing Facility Prior Approval Number:    Date Approved/Denied:   PASRR Number: [id number]  Discharge Plan:      Current Diagnoses: Patient Active Problem List   Diagnosis Date Noted   Fall 11/11/2023   Ground-level fall 11/11/2023   PSVT (paroxysmal supraventricular tachycardia) (HCC) 12/18/2013   Paroxysmal cardiac arrhythmia 12/16/2013   Heart palpitations 12/16/2013   HTN (hypertension) 12/16/2013   Hyperlipidemia 12/16/2013    Orientation RESPIRATION BLADDER Height & Weight     Self, Time, Situation, Place  Normal External catheter Weight: 130 lb 1.1 oz (59 kg) Height:  4' 11 (149.9 cm)  BEHAVIORAL SYMPTOMS/MOOD NEUROLOGICAL BOWEL NUTRITION STATUS        Diet (reg)  AMBULATORY STATUS COMMUNICATION OF NEEDS Skin   Extensive Assist Verbally Other (Comment) (redness)                       Personal Care Assistance Level of Assistance  Bathing, Feeding, Dressing Bathing Assistance: Maximum assistance   Dressing Assistance: Maximum assistance     Functional Limitations Info             SPECIAL CARE FACTORS FREQUENCY  PT (By licensed PT), OT (By licensed OT)     PT Frequency: 5 times per week OT Frequency: 5 times per week            Contractures      Additional Factors Info  Code Status, Allergies Code Status Info: full Allergies Info: nka           Current Medications  (11/12/2023):  This is the current hospital active medication list Current Facility-Administered Medications  Medication Dose Route Frequency Provider Last Rate Last Admin   acetaminophen  (TYLENOL ) tablet 1,000 mg  1,000 mg Oral Q6H Tammy Sor, PA-C   1,000 mg at 11/12/23 1207   carbidopa -levodopa  (SINEMET  IR) 25-100 MG per tablet immediate release 1 tablet  1 tablet Oral TID AC Osborne, Kelly, PA-C   1 tablet at 11/12/23 1207   docusate sodium  (COLACE) capsule 100 mg  100 mg Oral BID Tammy Sor, PA-C   100 mg at 11/12/23 0930   [START ON 11/13/2023] enoxaparin  (LOVENOX ) injection 30 mg  30 mg Subcutaneous Q12H Tammy Sor, PA-C       feeding supplement (ENSURE PLUS HIGH PROTEIN) liquid 237 mL  237 mL Oral BID BM Sebastian Moles, MD   237 mL at 11/12/23 1206   hydrALAZINE  (APRESOLINE ) injection 10 mg  10 mg Intravenous Q2H PRN Tammy Sor, PA-C       hydrochlorothiazide  (HYDRODIURIL ) tablet 25 mg  25 mg Oral Daily Tammy Sor, PA-C   25 mg at 11/12/23 0930   levothyroxine  (SYNTHROID ) tablet 100 mcg  100 mcg Oral q morning Tammy Sor, PA-C   100 mcg at 11/12/23 0505   losartan  (COZAAR ) tablet 100 mg  100 mg Oral Daily Tammy Sor, PA-C   100 mg at 11/12/23 0930  melatonin tablet 3 mg  3 mg Oral QHS PRN Tammy Sor, PA-C       methocarbamol  (ROBAXIN ) tablet 500 mg  500 mg Oral Q8H Tammy Sor, PA-C   500 mg at 11/12/23 9097   Or   methocarbamol  (ROBAXIN ) injection 500 mg  500 mg Intravenous Q8H Tammy Sor, PA-C       metoprolol  tartrate (LOPRESSOR ) injection 5 mg  5 mg Intravenous Q6H PRN Tammy Sor, PA-C   5 mg at 11/12/23 0509   morphine  (PF) 2 MG/ML injection 2 mg  2 mg Intravenous Q3H PRN Tammy Sor, PA-C   2 mg at 11/12/23 9391   ondansetron  (ZOFRAN -ODT) disintegrating tablet 4 mg  4 mg Oral Q6H PRN Tammy Sor, PA-C       Or   ondansetron  (ZOFRAN ) injection 4 mg  4 mg Intravenous Q6H PRN Tammy Sor, PA-C   4 mg at 11/12/23 0856   polyethylene  glycol (MIRALAX  / GLYCOLAX ) packet 17 g  17 g Oral Daily Tammy Sor, PA-C   17 g at 11/12/23 0930   prochlorperazine (COMPAZINE) injection 10 mg  10 mg Intravenous Q6H PRN Tammy Sor, PA-C       traMADol DANNY) tablet 100 mg  100 mg Oral Q6H PRN Tammy Sor, PA-C       traMADol DANNY) tablet 50 mg  50 mg Oral Q6H PRN Tammy Sor, PA-C         Discharge Medications: Please see discharge summary for a list of discharge medications.  Relevant Imaging Results:  Relevant Lab Results:   Additional Information SS #: [id number]  Amar Keenum E Trebor Galdamez, LCSW

## 2023-11-12 NOTE — TOC Initial Note (Addendum)
 Transition of Care [facility]) - Initial/Assessment Note    Patient Details  Name: Amber Rosales MRN: [id number] Date of Birth: Feb 11, 1941  Transition of Care [facility]) CM/SW Contact:    Kilah Drahos E Lasharn Bufkin, LCSW Phone Number: 11/12/2023, 1:19 PM  Clinical Narrative:                 Patient admitted from home post fall. Patient lives with her children, rotates between her daughter's home and son's home. PCP is Designer, television/film set. Patient has a rolling walker, rollator, grab bars, shower chair, and transport wheelchair at home.  Spoke with daughter Rhoda. Informed her of rec for SNF for STR. She denies SNF history. She states patient and family are agreeable to SNF, prefer [facility]. They live in [place]. LVM for [facility] at [facility].  SNF work up started.   Expected Discharge Plan: Skilled Nursing Facility Barriers to Discharge: Continued Medical Work up   Patient Goals and CMS Choice   CMS Medicare.gov Compare Post Acute Care list provided to:: Patient Represenative (must comment) Choice offered to / list presented to : Adult Children      Expected Discharge Plan and Services       Living arrangements for the past 2 months: Single Family Home                                      Prior Living Arrangements/Services Living arrangements for the past 2 months: Single Family Home Lives with:: Adult Children Patient language and need for interpreter reviewed:: Yes Do you feel safe going back to the place where you live?: Yes      Need for Family Participation in Patient Care: Yes (Comment) Care giver support system in place?: Yes (comment) Current home services: DME Criminal Activity/Legal Involvement Pertinent to Current Situation/Hospitalization: No - Comment as needed  Activities of Daily Living      Permission Sought/Granted Permission sought to share information with : Facility Industrial/product designer granted to share information with : Yes, Verbal Permission  Granted (by daughter Rhoda)     Permission granted to share info w AGENCY: SNFs        Emotional Assessment         Alcohol  / Substance Use: Not Applicable Psych Involvement: No (comment)  Admission diagnosis:  Fall [W19.XXXA] Fall, initial encounter [id number]] Ground-level fall [W18.30XA] Patient Active Problem List   Diagnosis Date Noted   Fall 11/11/2023   Ground-level fall 11/11/2023   PSVT (paroxysmal supraventricular tachycardia) (HCC) 12/18/2013   Paroxysmal cardiac arrhythmia 12/16/2013   Heart palpitations 12/16/2013   HTN (hypertension) 12/16/2013   Hyperlipidemia 12/16/2013   PCP:  Atilano Deward ORN, MD Pharmacy:   [facility] - [place], [place] - [address] [place] [address] Phone: [phone] Fax: [phone]     Social Drivers of Health (SDOH) Social History: SDOH Screenings   Food Insecurity: No Food Insecurity (11/11/2023)  Housing: Low Risk  (11/11/2023)  Transportation Needs: No Transportation Needs (11/11/2023)  Utilities: Not At Risk (11/11/2023)  Alcohol  Screen: Low Risk  (07/31/2021)  Depression (PHQ2-9): Low Risk  (07/31/2021)  Financial Resource Strain: Low Risk  (07/31/2021)  Physical Activity: Inactive (07/31/2021)  Social Connections: Moderately Integrated (11/11/2023)  Stress: No Stress Concern Present (07/31/2021)  Tobacco Use: Medium Risk (11/11/2023)   SDOH Interventions:     Readmission Risk Interventions     No data to  display

## 2023-11-12 NOTE — Evaluation (Signed)
 Physical Therapy Evaluation Patient Details Name: Amber Rosales MRN: [id number] DOB: 1940-11-24 Today's Date: 11/12/2023  History of Present Illness  83 yo female presents to ED 7/24 with fall at home. Pt sustained L 3-7 rib FX and small HPTX, L scapula FX,  R>L SD hygromas. PMH includes HLD, HTN, hypothyroidism, Anxiety, and Parkinson's disease.  Clinical Impression   Pt presents with generalized weakness, impaired standing balance with history of falls, significant L flank and shoulder pain s/p fall, max difficulty mobilizing, and decreased activity tolerance. Pt to benefit from acute PT to address deficits. Pt requiring mod-max assist for transfer-level mobility this date, very limited standing tolerance given pain. Pt also with desat to 86% on RA with activity, recovered to 93% with rest. Pt nauseous throughout session. Patient will benefit from continued inpatient follow up therapy, <3 hours/day. PT to progress mobility as tolerated, and will continue to follow acutely.          If plan is discharge home, recommend the following: Two people to help with bathing/dressing/bathroom;Two people to help with walking and/or transfers   Can travel by private vehicle   No    Equipment Recommendations None recommended by PT  Recommendations for Other Services       Functional Status Assessment Patient has had a recent decline in their functional status and demonstrates the ability to make significant improvements in function in a reasonable and predictable amount of time.     Precautions / Restrictions Precautions Precautions: Shoulder;Fall Type of Shoulder Precautions: no formal orders, advised to limit pushing/pulling, sling ordered for comfort when OOB. per Ortho, LUE WBAT Recall of Precautions/Restrictions: Impaired Restrictions Weight Bearing Restrictions Per Provider Order: No      Mobility  Bed Mobility Overal bed mobility: Needs Assistance Bed Mobility: Sit to Supine        Sit to supine: Max assist   General bed mobility comments: assist for trunk lowering, LE lift into bed, and boost up. Pt up in chair upon PT arrival to room    Transfers Overall transfer level: Needs assistance Equipment used: Rolling walker (2 wheels) Transfers: Sit to/from Stand, Bed to chair/wheelchair/BSC Sit to Stand: Max assist Stand pivot transfers: Max assist         General transfer comment: assist for power up at sacrum and trunk, rise, steady, and lateral stepping towards L back to bed. Pt with difficulty reaching full standing and very shuffling steps    Ambulation/Gait               General Gait Details: unable given pain and nausea  Stairs            Wheelchair Mobility     Tilt Bed    Modified Rankin (Stroke Patients Only)       Balance Overall balance assessment: Needs assistance Sitting-balance support: Feet supported Sitting balance-Leahy Scale: Fair     Standing balance support: Bilateral upper extremity supported, Reliant on assistive device for balance Standing balance-Leahy Scale: Poor                               Pertinent Vitals/Pain Pain Assessment Pain Assessment: Faces Faces Pain Scale: Hurts even more Pain Location: L shoulder, L flank due to rib fx Pain Descriptors / Indicators: Grimacing, Sharp Pain Intervention(s): Limited activity within patient's tolerance, Monitored during session, Repositioned    Home Living Family/patient expects to be discharged to:: Private residence Living Arrangements: Children Available  Help at Discharge: Family;Available PRN/intermittently Type of Home: House Home Access: Stairs to enter   Entrance Stairs-Number of Steps: 1   Home Layout: One level Home Equipment: Agricultural consultant (2 wheels);Rollator (4 wheels);Shower seat;Grab bars - tub/shower      Prior Function Prior Level of Function : Needs assist             Mobility Comments: States Mod I with AD, doesn't  use as she should.  Admits to falls. ADLs Comments: Family assists with ADL,iADL and community mobility.     Extremity/Trunk Assessment   Upper Extremity Assessment Upper Extremity Assessment: Defer to OT evaluation RUE Deficits / Details: Arthritic hands LUE Deficits / Details: non operative scapular fx.  Arthritic hands LUE: Shoulder pain with ROM LUE Sensation: WNL LUE Coordination: decreased fine motor    Lower Extremity Assessment Lower Extremity Assessment: Generalized weakness (lacking ~10 deg knee extension, suspect chronic)    Cervical / Trunk Assessment Cervical / Trunk Assessment: Kyphotic  Communication   Communication Communication: No apparent difficulties    Cognition Arousal: Alert Behavior During Therapy: Flat affect   PT - Cognitive impairments: Sequencing, Problem solving, Safety/Judgement                         Following commands: Impaired Following commands impaired: Follows one step commands with increased time     Cueing Cueing Techniques: Verbal cues, Gestural cues, Tactile cues     General Comments General comments (skin integrity, edema, etc.): Spo2 drop to 86% on RA during boost up in bed, recovered to 92-93% on RA when boosted up    Exercises     Assessment/Plan    PT Assessment Patient needs continued PT services  PT Problem List Decreased strength;Decreased mobility;Decreased safety awareness;Decreased activity tolerance;Decreased balance;Decreased knowledge of use of DME;Pain;Cardiopulmonary status limiting activity;Decreased knowledge of precautions       PT Treatment Interventions DME instruction;Gait training;Therapeutic exercise;Patient/family education;Therapeutic activities;Stair training;Balance training;Functional mobility training;Neuromuscular re-education    PT Goals (Current goals can be found in the Care Plan section)  Acute Rehab PT Goals Patient Stated Goal: get stronger PT Goal Formulation: With  patient Time For Goal Achievement: 11/26/23 Potential to Achieve Goals: Fair    Frequency Min 2X/week     Co-evaluation               AM-PAC PT 6 Clicks Mobility  Outcome Measure Help needed turning from your back to your side while in a flat bed without using bedrails?: A Lot Help needed moving from lying on your back to sitting on the side of a flat bed without using bedrails?: A Lot Help needed moving to and from a bed to a chair (including a wheelchair)?: A Lot Help needed standing up from a chair using your arms (e.g., wheelchair or bedside chair)?: A Lot Help needed to walk in hospital room?: Total Help needed climbing 3-5 steps with a railing? : Total 6 Click Score: 10    End of Session   Activity Tolerance: Patient limited by fatigue;Patient limited by pain Patient left: in bed;with call bell/phone within reach;with bed alarm set Nurse Communication: Mobility status PT Visit Diagnosis: Other abnormalities of gait and mobility (R26.89);Muscle weakness (generalized) (M62.81);Pain Pain - Right/Left: Left Pain - part of body: Shoulder (flank)    Time: 8985-8965 PT Time Calculation (min) (ACUTE ONLY): 20 min   Charges:   PT Evaluation $PT Eval Low Complexity: 1 Low   PT General Charges $$  ACUTE PT VISIT: 1 Visit         [place] Sorn S, PT DPT Acute Rehabilitation Services Secure Chat Preferred  Office [phone]   Johana FORBES [place] 11/12/2023, 12:23 PM

## 2023-11-12 NOTE — Progress Notes (Addendum)
 Subjective: Patient up in chair eating her breakfast.  Trouble sleeping overnight due to pain in her scapula and back from rib fxs.  No other complaints.  ROS: See above, otherwise other systems negative  Objective: Vital signs in last 24 hours: Temp:  [97.8 F (36.6 C)-98.4 F (36.9 C)] 98.4 F (36.9 C) (07/25 0700) Pulse Rate:  [71-97] 73 (07/25 0700) Resp:  [13-22] 13 (07/25 0700) BP: (142-200)/(72-100) 171/75 (07/25 0700) SpO2:  [90 %-95 %] 92 % (07/25 0700) Last BM Date : 11/09/23  Intake/Output from previous day: 07/24 0701 - 07/25 0700 In: 765.5 [P.O.:240; I.V.:525.5] Out: 1400 [Urine:1400] Intake/Output this shift: No intake/output data recorded.  PE: Gen: NAD, sitting up in her chair HEENT: PERRL Heart: regular Lungs: CTAB Abd: soft, NT Ext: benign tremor.  Finger contractures stable.  Still with limited ROM of LUE due to pain.  Edema remains in B ankles and LEs Neuro: no deficits Psych: A&OX3  Lab Results:  Recent Labs    11/11/23 0558 11/12/23 0616  WBC 12.3* 7.5  HGB 14.2 13.1  HCT 43.0 38.7  PLT 259 234   BMET Recent Labs    11/11/23 0558 11/12/23 0616  NA 137 136  K 3.5 3.7  CL 107 102  CO2 22 25  GLUCOSE 111* 100*  BUN 16 11  CREATININE 0.92 0.82  CALCIUM  8.9 9.0   PT/INR No results for input(s): LABPROT, INR in the last 72 hours. CMP     Component Value Date/Time   NA 136 11/12/2023 0616   K 3.7 11/12/2023 0616   CL 102 11/12/2023 0616   CO2 25 11/12/2023 0616   GLUCOSE 100 (H) 11/12/2023 0616   BUN 11 11/12/2023 0616   CREATININE 0.82 11/12/2023 0616   CREATININE 0.92 02/07/2014 1219   CALCIUM  9.0 11/12/2023 0616   PROT 6.5 11/11/2023 0558   ALBUMIN 3.7 11/11/2023 0558   AST 19 11/11/2023 0558   ALT 8 11/11/2023 0558   ALKPHOS 109 11/11/2023 0558   BILITOT 0.7 11/11/2023 0558   GFRNONAA >60 11/12/2023 0616   GFRNONAA 62 02/07/2014 1219   GFRAA 62 (L) 06/17/2014 0957   GFRAA 71 02/07/2014 1219   Lipase   No results found for: LIPASE     Studies/Results: DG CHEST PORT 1 VIEW Result Date: 11/12/2023 CLINICAL DATA:  Multiple left rib cage fractures. [id number]. EXAM: PORTABLE CHEST 1 VIEW COMPARISON:  Portable chest yesterday at 5:57 a.m., chest CT yesterday at 6:43 a.m. FINDINGS: 5:42 a.m. Multiple recent left rib cage fractures with displacement again noted. There is no measurable pneumothorax. There is interval increased left pleural effusion or hemothorax partially covering the left hemidiaphragm, with overlying patchy consolidation, atelectasis or contusive change in the left lower zone. The rest of the lungs are clear. There is mild cardiomegaly without evidence of CHF. The mediastinum is stable with aortic atherosclerosis and tortuosity. Moderate thoracic dextroscoliosis.  No new osseous abnormality. IMPRESSION: 1. Interval increased left pleural effusion or hemothorax partially covering the left hemidiaphragm, with overlying patchy consolidation, atelectasis or contusive change in the left lower zone. 2. No measurable pneumothorax. 3. Multiple recent left rib cage fractures with displacement again noted. 4. Aortic atherosclerosis. Electronically Signed   By: Francis Quam M.D.   On: 11/12/2023 07:39   CT CHEST ABDOMEN PELVIS W CONTRAST Result Date: 11/11/2023 EXAM: CT CHEST, ABDOMEN AND PELVIS WITH CONTRAST 11/11/2023 07:04:51 AM TECHNIQUE: CT of the chest, abdomen and pelvis was performed with the administration  of intravenous contrast. Multiplanar reformatted images are provided for review. Automated exposure control, iterative reconstruction, and/or weight based adjustment of the mA/kV was utilized to reduce the radiation dose to as low as reasonably achievable. COMPARISON: CT angio chest 12/18/2013 CLINICAL HISTORY: Polytrauma, blunt. Pt bib family after she was found in the floor at home. Pt c/o pain to L shoulder, L rib area, and back area. Denies LOC or hitting head. Not on blood thinners per  family. FINDINGS: CHEST: MEDIASTINUM: The heart is mildly enlarged. A locule of air anterior to the pericardium on the left appears to be intraperitoneal. This could represent additional pneumothorax with a slip of pleura extending anterior to the heart. THORACIC LYMPH NODES: No mediastinal, hilar or axillary lymphadenopathy. LUNGS AND PLEURA: Asymmetric dependent atelectasis is present at the left base. A tiny pneumothorax is present on image 23 of series 3. Left sixth and seventh posterolateral displaced rib fractures are present. Adjacent pleural effusions/hemothorax is present. ABDOMEN AND PELVIS: LIVER: The liver is unremarkable. GALLBLADDER AND BILE DUCTS: Gallbladder is unremarkable. No biliary ductal dilatation. SPLEEN: No acute abnormality. PANCREAS: No acute abnormality. ADRENAL GLANDS: No acute abnormality. KIDNEYS, URETERS AND BLADDER: No stones in the kidneys or ureters. No hydronephrosis. No perinephric or periureteral stranding. Urinary bladder is unremarkable. GI AND BOWEL: Stomach demonstrates no acute abnormality. There is no bowel obstruction. Diverticular changes are present in the sigmoid and descending colon without focal inflammation to suggest diverticulitis. REPRODUCTIVE ORGANS: A vaginal pessary is in place. PERITONEUM AND RETROPERITONEUM: No ascites. No free air. VASCULATURE: Atherosclerotic calcifications are present in the aorta and branch vessels. No aneurysm is present. ABDOMINAL AND PELVIS LYMPH NODES: No lymphadenopathy. REPRODUCTIVE ORGANS: No acute abnormality. BONES AND SOFT TISSUES: A comminuted left scapular fracture is present. The glenohumeral joint is intact. Chronic rightward curvature of the thoracic spine is present at T7-8. Leftward curvature in the lumbar spine is centered at L3. Fat herniates into the left inguinal canal. A periumbilical hernia contains fat without bowel. IMPRESSION: 1. Left sixth and seventh posterolateral displaced rib fractures with adjacent pleural  effusions/hemothorax and tiny pneumothorax. 2. Possible pneumoperitoneum versus additional pleural air anterior to the pericardium. 3. Comminuted left scapular fracture with intact glenohumeral joint. 4. Chronic rightward curvature of the thoracic spine at T7-8 and leftward curvature in the lumbar spine at L3. 5. Fat herniation into the left inguinal canal and periumbilical hernia containing fat without bowel. Critical findings were called to Dr. Zammit at 07:34 am. Electronically signed by: Lonni Necessary MD 11/11/2023 07:39 AM EDT RP Workstation: [id number]   CT CERVICAL SPINE WO CONTRAST Result Date: 11/11/2023 EXAM: CT CERVICAL SPINE WITHOUT CONTRAST 11/11/2023 07:04:51 AM TECHNIQUE: CT of the cervical spine was performed without the administration of intravenous contrast. Multiplanar reformatted images are provided for review. Automated exposure control, iterative reconstruction, and/or weight based adjustment of the mA/kV was utilized to reduce the radiation dose to as low as reasonably achievable. COMPARISON: None available. CLINICAL HISTORY: Neck trauma (Age >= 65y). Pt bib family after she was found in the floor at home. Pt c/o pain to L shoulder, L rib area, and back area. Denies LOC or hitting head. Not on blood thinners per family. FINDINGS: CERVICAL SPINE: BONES AND ALIGNMENT: Straightening of the normal cervical lordosis is present. Slight degenerative anterolisthesis is present at C3-4. DEGENERATIVE CHANGES: Chronic loss of disc height is present with uncovertebral spurring at C4-5, C5-6, C6-7 and C7-T1. SOFT TISSUES: No prevertebral soft tissue swelling. IMPRESSION: 1. No acute abnormality of the  cervical spine related to the reported neck trauma. 2. Straightening of the normal cervical lordosis. 3. Slight degenerative anterolisthesis at C3-4. 4. Chronic loss of disc height with uncovertebral spurring at C4-5, C5-6, C6-7, and C7-T1. Electronically signed by: Lonni Necessary MD 11/11/2023  07:21 AM EDT RP Workstation: [id number]   CT HEAD WO CONTRAST ( ) Result Date: 11/11/2023 EXAM: CT HEAD WITHOUT CONTRAST 11/11/2023 07:04:51 AM TECHNIQUE: CT of the head was performed without the administration of intravenous contrast. Automated exposure control, iterative reconstruction, and/or weight based adjustment of the mA/kV was utilized to reduce the radiation dose to as low as reasonably achievable. COMPARISON: 04/03/2016 CLINICAL HISTORY: Head trauma, moderate-severe. Pt bib family after she was found in the floor at home. Pt c/o pain to L shoulder, L rib area, and back area. Denies LOC or hitting head. Not on blood thinners per family. FINDINGS: BRAIN AND VENTRICLES: No acute infarct, parenchymal hemorrhage or mass lesion is present. Enlarging and somewhat asymmetrical extra-axial CSF density collections are present anteriorly. There is some mass effect with slight leftward midline shift and displacement of the anterior left frontal lobe away from the intracerebral falx. ORBITS: No acute abnormality. SINUSES: No acute abnormality. SOFT TISSUES AND SKULL: No acute soft tissue abnormality. No skull fracture. VASCULATURE: Atherosclerotic calcifications are present in the cavernous carotid arteries bilaterally and at the dural margin of both vertebral arteries. No hyperdense vessel is present. IMPRESSION: 1. Enlarging and somewhat asymmetrical extra-axial CSF density collections anteriorly, with mass effect including slight leftward midline shift and displacement of the anterior left frontal lobe away from the intracerebral falx. This suggests the possibility of an acute traumatic subdural hygroma. 2. No acute infarct, parenchymal hemorrhage, or mass lesion. Electronically signed by: Lonni Necessary MD 11/11/2023 07:19 AM EDT RP Workstation: [id number]   DG Chest Port 1 View Result Date: 11/11/2023 EXAM: 1 VIEW XRAY OF THE CHEST 11/11/2023 06:06:00 AM COMPARISON: 06/17/2016 CLINICAL HISTORY: Fall  earlier this morning onto left side region regarding left shoulder pain. FINDINGS: LUNGS AND PLEURA: Mild atelectasis and possible small left effusion are noted. The right lung is clear. No pneumothorax. HEART AND MEDIASTINUM: No acute abnormality of the cardiac and mediastinal silhouettes. BONES AND SOFT TISSUES: Acute left third, fourth and fifth rib fractures are present. IMPRESSION: 1. Acute left third, fourth, and fifth rib fractures. 2. Mild atelectasis and possible small left effusion. No pneumothorax. Electronically signed by: Lonni Necessary MD 11/11/2023 07:03 AM EDT RP Workstation: [id number]   DG Shoulder Left Portable Result Date: 11/11/2023 EXAM: 1 VIEW XRAY OF THE LEFT SHOULDER 11/11/2023 06:04:00 AM COMPARISON: None available. CLINICAL HISTORY: Fall. Fell earlier this morning onto left side region regarding left shoulder pain. FINDINGS: BONES AND JOINTS: Degenerative changes are present in the left shoulder. The left shoulder is located. Left third, fourth and fifth rib fractures are noted. No acute fracture or dislocation of the left shoulder. SOFT TISSUES: No pneumothorax is present. Atherosclerotic changes are present at the aortic arch. IMPRESSION: 1. Left third, fourth, and fifth rib fractures. No pneumothorax. 2. Degenerative changes in the left shoulder. No acute osseous abnormality of the left shoulder. Electronically signed by: Lonni Necessary MD 11/11/2023 07:00 AM EDT RP Workstation: [id number]    Anti-infectives: Anti-infectives (From admission, onward)    None        Assessment/Plan Fall Hygroma - NSGY, Dr. Debby following.  No acute intervention.  Follow up head CT in 3 months L rib fxs 3-7 with tiny HPTX, increasing effusion - pain control, IS, pulm  toilet, PT/OT, repeat CXR this am with increase in pleural effusion.  Repeat CXR in am and monitor.  Sating in low to mid 90s on RA and no distress.  Hgb stable L comminuted scapula fx - per ortho, no  intervention HTN - resume home med as well as hydrochlorothiazide  for LE edema, prn meds available as well Parkinson's disease - resume home med Hypothyroidism - resume home med HLD FEN - regular diet, SLIV VTE - Lovenox  ID - none Admit -therapies, so far OT rec SNF    I reviewed Consultant NSGY notes, last 24 h vitals and pain scores, last 48 h intake and output, last 24 h labs and trends, and last 24 h imaging results.   LOS: 1 day    Burnard FORBES Banter , [facility] Surgery 11/12/2023, 8:54 AM Please see Amion for pager number during day hours 7:00am-4:30pm or 7:00am -11:30am on weekends

## 2023-11-12 NOTE — Consult Note (Signed)
 HPI:     Patient is a 83 y.o. female w/ PMH of HLD, HTN, hypothyroidism, Anxiety, and PD who presented to [facility] ED after a fall. Transferred to [facility] for further mgmt of L rib fractures, scapula fx, as well as for potentially enlarging hygroma for which NSGY was consulted. Currently c/o L chest pain and L shoulder pain. No recent or increasing acute HA, mental status change, speech difficulty, visual change, UE/LE symptoms, facial change.     Patient Active Problem List   Diagnosis Date Noted   Fall 11/11/2023   PSVT (paroxysmal supraventricular tachycardia) (HCC) 12/18/2013   Paroxysmal cardiac arrhythmia 12/16/2013   Heart palpitations 12/16/2013   HTN (hypertension) 12/16/2013   Hyperlipidemia 12/16/2013   Past Medical History:  Diagnosis Date   Anxiety    Hyperlipidemia    Hypertension    Hypothyroidism    Parkinson's disease (HCC)    Tremor    Vertigo     Past Surgical History:  Procedure Laterality Date   DILATION AND CURETTAGE OF UTERUS     WISDOM TOOTH EXTRACTION      (Not in a hospital admission)  No Known Allergies  Social History   Tobacco Use   Smoking status: Former    Current packs/day: 0.00    Types: Cigarettes    Start date: 12/17/1947    Quit date: 12/16/1957    Years since quitting: 65.9   Smokeless tobacco: Never  Substance Use Topics   Alcohol  use: No    Family History  Problem Relation Age of Onset   Hypertension Mother    Cancer Mother    CAD Father    Heart failure Sister    Heart failure Brother    Objective:   Temp:  [97.8 F (36.6 C)-98.4 F (36.9 C)] 98.4 F (36.9 C) (07/25 0700) Pulse Rate:  [71-97] 73 (07/25 0700) Resp:  [13-22] 13 (07/25 0700) BP: (142-200)/(75-100) 171/75 (07/25 0700) SpO2:  [91 %-95 %] 92 % (07/25 0700)   Awake, alert, oriented Speech fluent, appropriate MAEs, strength grossly intact.  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   Plan:   -No neurosurgical intervention indicated. She may follow up as an outpatient in 53m with repeat CTH.   Lynetta Tomczak CAYLIN Jahari Wiginton, PA-C

## 2023-11-12 NOTE — Evaluation (Signed)
 Occupational Therapy Evaluation Patient Details Name: Amber Rosales MRN: [id number] DOB: 30-Jul-1940 Today's Date: 11/12/2023   History of Present Illness   83 y.o. female w/ PMH of HLD, HTN, hypothyroidism, anxiety, and PD who presented to [facility] ED after a fall. Transferred to [facility] for further mgmt of L rib fractures, scapula fx, as well as for potentially enlarging hygroma, review of imaging, there is b/l R>L hygroma vs SDH.     Clinical Impressions Patient admitted for the diagnosis above.  PTA she lived with one of her three children, switching between them every few months.  She plans on discharging to her son's home, information provided in home setup, upon discharge.  Patient has a RW, but does not use it consistently.  In addition, family assist with ADL, iADL and community mobility.  Patient currently is needing Max A for simple transfer and ADL completion.  OT will follow in the acute setting to address deficits, and Patient will benefit from continued inpatient follow up therapy, <3 hours/day.  Patient stating all her children work, and she does not have 24 hour assist needed at home.       If plan is discharge home, recommend the following:   Assist for transportation;Assistance with cooking/housework;A lot of help with walking and/or transfers;A lot of help with bathing/dressing/bathroom     Functional Status Assessment   Patient has had a recent decline in their functional status and demonstrates the ability to make significant improvements in function in a reasonable and predictable amount of time.     Equipment Recommendations   BSC/3in1     Recommendations for Other Services         Precautions/Restrictions   Precautions Precautions: Shoulder;Fall Type of Shoulder Precautions: no formal orders, advised to limit pushing/pulling, sling ordered for comfort when OOB Recall of Precautions/Restrictions: Impaired Restrictions Weight Bearing Restrictions Per Provider  Order: No     Mobility Bed Mobility Overal bed mobility: Needs Assistance Bed Mobility: Supine to Sit     Supine to sit: Max assist, HOB elevated     General bed mobility comments: no real initiation    Transfers Overall transfer level: Needs assistance   Transfers: Sit to/from Stand, Bed to chair/wheelchair/BSC Sit to Stand: Mod assist, Max assist Stand pivot transfers: Mod assist, Max assist         General transfer comment: unable to achieve a full stand      Balance Overall balance assessment: Needs assistance Sitting-balance support: Feet supported Sitting balance-Leahy Scale: Fair     Standing balance support: Bilateral upper extremity supported Standing balance-Leahy Scale: Poor                             ADL either performed or assessed with clinical judgement   ADL Overall ADL's : Needs assistance/impaired Eating/Feeding: Set up;Sitting   Grooming: Wash/dry hands;Wash/dry face;Minimal assistance;Sitting   Upper Body Bathing: Maximal assistance;Sitting   Lower Body Bathing: Maximal assistance;Bed level   Upper Body Dressing : Maximal assistance;Sitting   Lower Body Dressing: Maximal assistance;Bed level   Toilet Transfer: Moderate assistance;Maximal assistance;BSC/3in1;Stand-pivot   Toileting- Clothing Manipulation and Hygiene: Total assistance;Sit to/from stand               Vision Patient Visual Report: No change from baseline       Perception Perception: Not tested       Praxis Praxis: Not tested       Pertinent Vitals/Pain Pain Assessment  Pain Assessment: Faces Faces Pain Scale: Hurts even more Pain Location: L shoulder Pain Descriptors / Indicators: Grimacing, Sharp Pain Intervention(s): Monitored during session     Extremity/Trunk Assessment Upper Extremity Assessment Upper Extremity Assessment: Right hand dominant;LUE deficits/detail;RUE deficits/detail RUE Deficits / Details: Arthritic hands LUE  Deficits / Details: non operative scapular fx.  Arthritic hands LUE: Shoulder pain with ROM LUE Sensation: WNL LUE Coordination: decreased fine motor   Lower Extremity Assessment Lower Extremity Assessment: Defer to PT evaluation   Cervical / Trunk Assessment Cervical / Trunk Assessment: Kyphotic   Communication Communication Communication: No apparent difficulties   Cognition Arousal: Alert Behavior During Therapy: Flat affect Cognition: Cognition impaired       Memory impairment (select all impairments): Short-term memory   Executive functioning impairment (select all impairments): Initiation                   Following commands: Impaired Following commands impaired: Follows one step commands with increased time     Cueing  General Comments   Cueing Techniques: Verbal cues;Gestural cues;Tactile cues   Watch BP   Exercises     Shoulder Instructions      Home Living Family/patient expects to be discharged to:: Private residence Living Arrangements: Children Available Help at Discharge: Family;Available PRN/intermittently Type of Home: House Home Access: Stairs to enter [employer] of Steps: 1   Home Layout: One level     Bathroom Shower/Tub: Producer, television/film/video: Standard Bathroom Accessibility: Yes How Accessible: Accessible via walker Home Equipment: Rolling Walker (2 wheels);Rollator (4 wheels);Shower seat;Grab bars - tub/shower          Prior Functioning/Environment Prior Level of Function : Needs assist             Mobility Comments: States Mod I with AD, doesn't use as she should.  Admits to falls. ADLs Comments: Family assists with ADL,iADL and community mobility.    OT Problem List: Decreased strength;Decreased range of motion;Decreased activity tolerance;Impaired balance (sitting and/or standing);Decreased knowledge of use of DME or AE;Decreased safety awareness;Pain;Increased edema   OT  Treatment/Interventions: Self-care/ADL training;Therapeutic activities;Patient/family education;DME and/or AE instruction;Balance training      OT Goals(Current goals can be found in the care plan section)   Acute Rehab OT Goals Patient Stated Goal: Return to sons's home OT Goal Formulation: With patient Time For Goal Achievement: 11/26/23 Potential to Achieve Goals: Fair ADL Goals Pt Will Perform Upper Body Bathing: with min assist;sitting Pt Will Perform Lower Body Bathing: with mod assist;sit to/from stand Pt Will Perform Upper Body Dressing: with min assist;sitting Pt Will Perform Lower Body Dressing: with mod assist;sit to/from stand Pt Will Transfer to Toilet: with min assist;stand pivot transfer;bedside commode   OT Frequency:  Min 2X/week    Co-evaluation              AM-PAC OT 6 Clicks Daily Activity     Outcome Measure Help from another person eating meals?: A Little Help from another person taking care of personal grooming?: A Little Help from another person toileting, which includes using toliet, bedpan, or urinal?: A Lot Help from another person bathing (including washing, rinsing, drying)?: A Lot Help from another person to put on and taking off regular upper body clothing?: A Lot Help from another person to put on and taking off regular lower body clothing?: A Lot 6 Click Score: 14   End of Session Nurse Communication: Mobility status  Activity Tolerance: Patient tolerated treatment well Patient  left: in chair;with call bell/phone within reach;with chair alarm set  OT Visit Diagnosis: Unsteadiness on feet (R26.81);History of falling (Z91.81);Muscle weakness (generalized) (M62.81);Pain Pain - Right/Left: Left Pain - part of body: Shoulder                Time: 9184-9159 OT Time Calculation (min): 25 min Charges:  OT General Charges $OT Visit: 1 Visit OT Evaluation $OT Eval Moderate Complexity: 1 Mod OT Treatments $Self Care/Home Management : 8-22  mins  11/12/2023  RP, OTR/L  Acute Rehabilitation Services  Office:  [phone]   Charlie JONETTA Halsted 11/12/2023, 8:49 AM

## 2023-11-12 NOTE — Consult Note (Signed)
 Reason for Consult:Left scapula fx Referring Physician: Dann Hummer Time called: 0730 Time at bedside: 0900   Amber Rosales is an 83 y.o. female.  HPI: Natalya got up to use the bathroom and fell. She was brought to [facility] where workup showed a left scapula fx in addition to other injuries. She was transferred to [facility] and orthopedic surgery was consulted. She is RHD, lives on a rotating basis with her children, and does not use any assistive devices to ambulate.  Past Medical History:  Diagnosis Date   Anxiety    Hyperlipidemia    Hypertension    Hypothyroidism    Parkinson's disease (HCC)    Tremor    Vertigo     Past Surgical History:  Procedure Laterality Date   DILATION AND CURETTAGE OF UTERUS     WISDOM TOOTH EXTRACTION      Family History  Problem Relation Age of Onset   Hypertension Mother    Cancer Mother    CAD Father    Heart failure Sister    Heart failure Brother     Social History:  reports that she quit smoking about 65 years ago. Her smoking use included cigarettes. She started smoking about 75 years ago. She has never used smokeless tobacco. She reports that she does not drink alcohol  and does not use drugs.  Allergies: No Known Allergies  Medications: I have reviewed the patient's current medications.  Results for orders placed or performed during the hospital encounter of 11/11/23 (from the past 48 hours)  CBC     Status: Abnormal   Collection Time: 11/11/23  5:58 AM  Result Value Ref Range   WBC 12.3 (H) 4.0 - 10.5 K/uL   RBC 4.66 3.87 - 5.11 MIL/uL   Hemoglobin 14.2 12.0 - 15.0 g/dL   HCT 56.9 63.9 - 53.9 %   MCV 92.3 80.0 - 100.0 fL   MCH 30.5 26.0 - 34.0 pg   MCHC 33.0 30.0 - 36.0 g/dL   RDW 86.8 88.4 - 84.4 %   Platelets 259 150 - 400 K/uL   nRBC 0.0 0.0 - 0.2 %    Comment: Performed at [facility], [address] [place], [place] [address]  Comprehensive metabolic panel with GFR     Status: Abnormal   Collection Time: 11/11/23  5:58 AM   Result Value Ref Range   Sodium 137 135 - 145 mmol/L   Potassium 3.5 3.5 - 5.1 mmol/L   Chloride 107 98 - 111 mmol/L   CO2 22 22 - 32 mmol/L   Glucose, Bld 111 (H) 70 - 99 mg/dL    Comment: Glucose reference range applies only to samples taken after fasting for at least 8 hours.   BUN 16 8 - 23 mg/dL   Creatinine, Ser 9.07 0.44 - 1.00 mg/dL   Calcium  8.9 8.9 - 10.3 mg/dL   Total Protein 6.5 6.5 - 8.1 g/dL   Albumin 3.7 3.5 - 5.0 g/dL   AST 19 15 - 41 U/L   ALT 8 0 - 44 U/L   Alkaline Phosphatase 109 38 - 126 U/L   Total Bilirubin 0.7 0.0 - 1.2 mg/dL   GFR, Estimated >39 >39 mL/min    Comment: (NOTE) Calculated using the CKD-EPI Creatinine Equation (2021)    Anion gap 8 5 - 15    Comment: Performed at [facility], [address] [place], [place] [address]  CBC     Status: None   Collection Time: 11/12/23  6:16  AM  Result Value Ref Range   WBC 7.5 4.0 - 10.5 K/uL   RBC 4.27 3.87 - 5.11 MIL/uL   Hemoglobin 13.1 12.0 - 15.0 g/dL   HCT 61.2 63.9 - 53.9 %   MCV 90.6 80.0 - 100.0 fL   MCH 30.7 26.0 - 34.0 pg   MCHC 33.9 30.0 - 36.0 g/dL   RDW 86.8 88.4 - 84.4 %   Platelets 234 150 - 400 K/uL   nRBC 0.0 0.0 - 0.2 %    Comment: Performed at [facility], [address] [place], [place] [address]  Basic metabolic panel     Status: Abnormal   Collection Time: 11/12/23  6:16 AM  Result Value Ref Range   Sodium 136 135 - 145 mmol/L   Potassium 3.7 3.5 - 5.1 mmol/L   Chloride 102 98 - 111 mmol/L   CO2 25 22 - 32 mmol/L   Glucose, Bld 100 (H) 70 - 99 mg/dL    Comment: Glucose reference range applies only to samples taken after fasting for at least 8 hours.   BUN 11 8 - 23 mg/dL   Creatinine, Ser 9.17 0.44 - 1.00 mg/dL   Calcium  9.0 8.9 - 10.3 mg/dL   GFR, Estimated >39 >39 mL/min    Comment: (NOTE) Calculated using the CKD-EPI Creatinine Equation (2021)    Anion gap 9 5 - 15    Comment: Performed at [facility], [address] [place], [place] [address]     DG CHEST PORT 1 VIEW Result Date: 11/12/2023 CLINICAL DATA:  Multiple left rib cage fractures. [id number]. EXAM: PORTABLE CHEST 1 VIEW COMPARISON:  Portable chest yesterday at 5:57 a.m., chest CT yesterday at 6:43 a.m. FINDINGS: 5:42 a.m. Multiple recent left rib cage fractures with displacement again noted. There is no measurable pneumothorax. There is interval increased left pleural effusion or hemothorax partially covering the left hemidiaphragm, with overlying patchy consolidation, atelectasis or contusive change in the left lower zone. The rest of the lungs are clear. There is mild cardiomegaly without evidence of CHF. The mediastinum is stable with aortic atherosclerosis and tortuosity. Moderate thoracic dextroscoliosis.  No new osseous abnormality. IMPRESSION: 1. Interval increased left pleural effusion or hemothorax partially covering the left hemidiaphragm, with overlying patchy consolidation, atelectasis or contusive change in the left lower zone. 2. No measurable pneumothorax. 3. Multiple recent left rib cage fractures with displacement again noted. 4. Aortic atherosclerosis. Electronically Signed   By: Francis Quam M.D.   On: 11/12/2023 07:39   CT CHEST ABDOMEN PELVIS W CONTRAST Result Date: 11/11/2023 EXAM: CT CHEST, ABDOMEN AND PELVIS WITH CONTRAST 11/11/2023 07:04:51 AM TECHNIQUE: CT of the chest, abdomen and pelvis was performed with the administration of intravenous contrast. Multiplanar reformatted images are provided for review. Automated exposure control, iterative reconstruction, and/or weight based adjustment of the mA/kV was utilized to reduce the radiation dose to as low as reasonably achievable. COMPARISON: CT angio chest 12/18/2013 CLINICAL HISTORY: Polytrauma, blunt. Pt bib family after she was found in the floor at home. Pt c/o pain to L shoulder, L rib area, and back area. Denies LOC or hitting head. Not on blood thinners per family. FINDINGS: CHEST: MEDIASTINUM: The heart is  mildly enlarged. A locule of air anterior to the pericardium on the left appears to be intraperitoneal. This could represent additional pneumothorax with a slip of pleura extending anterior to the heart. THORACIC LYMPH NODES: No mediastinal, hilar or axillary lymphadenopathy. LUNGS AND PLEURA: Asymmetric dependent  atelectasis is present at the left base. A tiny pneumothorax is present on image 23 of series 3. Left sixth and seventh posterolateral displaced rib fractures are present. Adjacent pleural effusions/hemothorax is present. ABDOMEN AND PELVIS: LIVER: The liver is unremarkable. GALLBLADDER AND BILE DUCTS: Gallbladder is unremarkable. No biliary ductal dilatation. SPLEEN: No acute abnormality. PANCREAS: No acute abnormality. ADRENAL GLANDS: No acute abnormality. KIDNEYS, URETERS AND BLADDER: No stones in the kidneys or ureters. No hydronephrosis. No perinephric or periureteral stranding. Urinary bladder is unremarkable. GI AND BOWEL: Stomach demonstrates no acute abnormality. There is no bowel obstruction. Diverticular changes are present in the sigmoid and descending colon without focal inflammation to suggest diverticulitis. REPRODUCTIVE ORGANS: A vaginal pessary is in place. PERITONEUM AND RETROPERITONEUM: No ascites. No free air. VASCULATURE: Atherosclerotic calcifications are present in the aorta and branch vessels. No aneurysm is present. ABDOMINAL AND PELVIS LYMPH NODES: No lymphadenopathy. REPRODUCTIVE ORGANS: No acute abnormality. BONES AND SOFT TISSUES: A comminuted left scapular fracture is present. The glenohumeral joint is intact. Chronic rightward curvature of the thoracic spine is present at T7-8. Leftward curvature in the lumbar spine is centered at L3. Fat herniates into the left inguinal canal. A periumbilical hernia contains fat without bowel. IMPRESSION: 1. Left sixth and seventh posterolateral displaced rib fractures with adjacent pleural effusions/hemothorax and tiny pneumothorax. 2.  Possible pneumoperitoneum versus additional pleural air anterior to the pericardium. 3. Comminuted left scapular fracture with intact glenohumeral joint. 4. Chronic rightward curvature of the thoracic spine at T7-8 and leftward curvature in the lumbar spine at L3. 5. Fat herniation into the left inguinal canal and periumbilical hernia containing fat without bowel. Critical findings were called to Dr. Zammit at 07:34 am. Electronically signed by: Lonni Necessary MD 11/11/2023 07:39 AM EDT RP Workstation: [id number]   CT CERVICAL SPINE WO CONTRAST Result Date: 11/11/2023 EXAM: CT CERVICAL SPINE WITHOUT CONTRAST 11/11/2023 07:04:51 AM TECHNIQUE: CT of the cervical spine was performed without the administration of intravenous contrast. Multiplanar reformatted images are provided for review. Automated exposure control, iterative reconstruction, and/or weight based adjustment of the mA/kV was utilized to reduce the radiation dose to as low as reasonably achievable. COMPARISON: None available. CLINICAL HISTORY: Neck trauma (Age >= 65y). Pt bib family after she was found in the floor at home. Pt c/o pain to L shoulder, L rib area, and back area. Denies LOC or hitting head. Not on blood thinners per family. FINDINGS: CERVICAL SPINE: BONES AND ALIGNMENT: Straightening of the normal cervical lordosis is present. Slight degenerative anterolisthesis is present at C3-4. DEGENERATIVE CHANGES: Chronic loss of disc height is present with uncovertebral spurring at C4-5, C5-6, C6-7 and C7-T1. SOFT TISSUES: No prevertebral soft tissue swelling. IMPRESSION: 1. No acute abnormality of the cervical spine related to the reported neck trauma. 2. Straightening of the normal cervical lordosis. 3. Slight degenerative anterolisthesis at C3-4. 4. Chronic loss of disc height with uncovertebral spurring at C4-5, C5-6, C6-7, and C7-T1. Electronically signed by: Lonni Necessary MD 11/11/2023 07:21 AM EDT RP Workstation: [id number]   CT  HEAD WO CONTRAST ( ) Result Date: 11/11/2023 EXAM: CT HEAD WITHOUT CONTRAST 11/11/2023 07:04:51 AM TECHNIQUE: CT of the head was performed without the administration of intravenous contrast. Automated exposure control, iterative reconstruction, and/or weight based adjustment of the mA/kV was utilized to reduce the radiation dose to as low as reasonably achievable. COMPARISON: 04/03/2016 CLINICAL HISTORY: Head trauma, moderate-severe. Pt bib family after she was found in the floor at home. Pt c/o pain to L shoulder, L  rib area, and back area. Denies LOC or hitting head. Not on blood thinners per family. FINDINGS: BRAIN AND VENTRICLES: No acute infarct, parenchymal hemorrhage or mass lesion is present. Enlarging and somewhat asymmetrical extra-axial CSF density collections are present anteriorly. There is some mass effect with slight leftward midline shift and displacement of the anterior left frontal lobe away from the intracerebral falx. ORBITS: No acute abnormality. SINUSES: No acute abnormality. SOFT TISSUES AND SKULL: No acute soft tissue abnormality. No skull fracture. VASCULATURE: Atherosclerotic calcifications are present in the cavernous carotid arteries bilaterally and at the dural margin of both vertebral arteries. No hyperdense vessel is present. IMPRESSION: 1. Enlarging and somewhat asymmetrical extra-axial CSF density collections anteriorly, with mass effect including slight leftward midline shift and displacement of the anterior left frontal lobe away from the intracerebral falx. This suggests the possibility of an acute traumatic subdural hygroma. 2. No acute infarct, parenchymal hemorrhage, or mass lesion. Electronically signed by: Lonni Necessary MD 11/11/2023 07:19 AM EDT RP Workstation: [id number]   DG Chest Port 1 View Result Date: 11/11/2023 EXAM: 1 VIEW XRAY OF THE CHEST 11/11/2023 06:06:00 AM COMPARISON: 06/17/2016 CLINICAL HISTORY: Fall earlier this morning onto left side region  regarding left shoulder pain. FINDINGS: LUNGS AND PLEURA: Mild atelectasis and possible small left effusion are noted. The right lung is clear. No pneumothorax. HEART AND MEDIASTINUM: No acute abnormality of the cardiac and mediastinal silhouettes. BONES AND SOFT TISSUES: Acute left third, fourth and fifth rib fractures are present. IMPRESSION: 1. Acute left third, fourth, and fifth rib fractures. 2. Mild atelectasis and possible small left effusion. No pneumothorax. Electronically signed by: Lonni Necessary MD 11/11/2023 07:03 AM EDT RP Workstation: [id number]   DG Shoulder Left Portable Result Date: 11/11/2023 EXAM: 1 VIEW XRAY OF THE LEFT SHOULDER 11/11/2023 06:04:00 AM COMPARISON: None available. CLINICAL HISTORY: Fall. Fell earlier this morning onto left side region regarding left shoulder pain. FINDINGS: BONES AND JOINTS: Degenerative changes are present in the left shoulder. The left shoulder is located. Left third, fourth and fifth rib fractures are noted. No acute fracture or dislocation of the left shoulder. SOFT TISSUES: No pneumothorax is present. Atherosclerotic changes are present at the aortic arch. IMPRESSION: 1. Left third, fourth, and fifth rib fractures. No pneumothorax. 2. Degenerative changes in the left shoulder. No acute osseous abnormality of the left shoulder. Electronically signed by: Lonni Necessary MD 11/11/2023 07:00 AM EDT RP Workstation: [id number]    Review of Systems  HENT:  Negative for ear discharge, ear pain, hearing loss and tinnitus.   Eyes:  Negative for photophobia and pain.  Respiratory:  Negative for cough and shortness of breath.   Cardiovascular:  Positive for chest pain.  Gastrointestinal:  Negative for abdominal pain, nausea and vomiting.  Genitourinary:  Negative for dysuria, flank pain, frequency and urgency.  Musculoskeletal:  Positive for back pain. Negative for arthralgias, myalgias and neck pain.  Neurological:  Negative for dizziness and  headaches.  Hematological:  Does not bruise/bleed easily.  Psychiatric/Behavioral:  The patient is not nervous/anxious.    Blood pressure (!) 171/75, pulse 73, temperature 98.4 F (36.9 C), temperature source Oral, resp. rate 13, height 4' 11 (1.499 m), weight 59 kg, SpO2 92%. Physical Exam Constitutional:      General: She is not in acute distress.    Appearance: She is well-developed. She is not diaphoretic.  HENT:     Head: Normocephalic and atraumatic.  Eyes:     General: No scleral icterus.  Right eye: No discharge.        Left eye: No discharge.     Conjunctiva/sclera: Conjunctivae normal.  Cardiovascular:     Rate and Rhythm: Normal rate and regular rhythm.  Pulmonary:     Effort: Pulmonary effort is normal. No respiratory distress.  Musculoskeletal:     Cervical back: Normal range of motion.     Comments: Left shoulder, elbow, wrist, digits- no skin wounds, nontender, no instability, no blocks to motion  Sens  Ax/R/M/U intact  Mot   Ax/ R/ PIN/ M/ AIN/ U grossly intact  Rad 2+  Skin:    General: Skin is warm and dry.  Neurological:     Mental Status: She is alert.  Psychiatric:        Mood and Affect: Mood normal.        Behavior: Behavior normal.     Assessment/Plan: Left scapula fx -- Plan non-operative management with WBAT LUE. Doubt sling is necessary but if she seems to struggle with that arm could provide one for comfort. F/u with Dr. Kendal in 3 weeks.    Ozell DOROTHA Ned, PA-C Orthopedic Surgery [phone] 11/12/2023, 9:12 AM

## 2023-11-13 ENCOUNTER — Inpatient Hospital Stay (HOSPITAL_COMMUNITY)

## 2023-11-13 NOTE — Plan of Care (Signed)
  Problem: Clinical Measurements: Goal: Ability to maintain clinical measurements within normal limits will improve 11/13/2023 1753 by Evander Arley SQUIBB, RN Outcome: Progressing 11/13/2023 1753 by Evander Arley SQUIBB, RN Outcome: Progressing Goal: Will remain free from infection 11/13/2023 1753 by Evander Arley SQUIBB, RN Outcome: Progressing 11/13/2023 1753 by Evander Arley SQUIBB, RN Outcome: Progressing Goal: Diagnostic test results will improve 11/13/2023 1753 by Evander Arley SQUIBB, RN Outcome: Progressing 11/13/2023 1753 by Evander Arley SQUIBB, RN Outcome: Progressing Goal: Respiratory complications will improve 11/13/2023 1753 by Evander Arley SQUIBB, RN Outcome: Progressing 11/13/2023 1753 by Evander Arley SQUIBB, RN Outcome: Progressing Goal: Cardiovascular complication will be avoided 11/13/2023 1753 by Evander Arley SQUIBB, RN Outcome: Progressing 11/13/2023 1753 by Evander Arley SQUIBB, RN Outcome: Progressing   Problem: Activity: Goal: Risk for activity intolerance will decrease 11/13/2023 1753 by Evander Arley SQUIBB, RN Outcome: Progressing 11/13/2023 1753 by Evander Arley SQUIBB, RN Outcome: Progressing   Problem: Coping: Goal: Level of anxiety will decrease 11/13/2023 1753 by Evander Arley SQUIBB, RN Outcome: Progressing 11/13/2023 1753 by Evander Arley SQUIBB, RN Outcome: Progressing   Problem: Elimination: Goal: Will not experience complications related to bowel motility 11/13/2023 1753 by Evander Arley SQUIBB, RN Outcome: Progressing 11/13/2023 1753 by Evander Arley SQUIBB, RN Outcome: Progressing Goal: Will not experience complications related to urinary retention 11/13/2023 1753 by Evander Arley SQUIBB, RN Outcome: Progressing 11/13/2023 1753 by Evander Arley SQUIBB, RN Outcome: Progressing   Problem: Pain Managment: Goal: General experience of comfort will improve and/or be controlled 11/13/2023 1753 by Evander Arley SQUIBB, RN Outcome: Progressing 11/13/2023 1753 by Evander Arley SQUIBB, RN Outcome: Progressing   Problem:  Safety: Goal: Ability to remain free from injury will improve 11/13/2023 1753 by Evander Arley SQUIBB, RN Outcome: Progressing 11/13/2023 1753 by Evander Arley SQUIBB, RN Outcome: Progressing   Problem: Skin Integrity: Goal: Risk for impaired skin integrity will decrease 11/13/2023 1753 by Evander Arley SQUIBB, RN Outcome: Progressing 11/13/2023 1753 by Evander Arley SQUIBB, RN Outcome: Progressing

## 2023-11-13 NOTE — Progress Notes (Signed)
 Subjective: Nausea much better today after oxy stopped.  Eating better.  Pain controlled when she takes her meds.  No new complaints.  ROS: See above, otherwise other systems negative  Objective: Vital signs in last 24 hours: Temp:  [97.5 F (36.4 C)-98.3 F (36.8 C)] 97.7 F (36.5 C) (07/26 0719) Pulse Rate:  [67-92] 80 (07/26 0719) Resp:  [13-21] 14 (07/26 0719) BP: (143-168)/(63-84) 149/79 (07/26 0719) SpO2:  [91 %-97 %] 95 % (07/26 0719) Last BM Date : 11/09/23  Intake/Output from previous day: 07/25 0701 - 07/26 0700 In: -  Out: 300 [Urine:300] Intake/Output this shift: No intake/output data recorded.  PE: Gen: NAD, sitting up in her chair HEENT: PERRL Heart: regular Lungs: CTAB, some decrease BS at LLL Abd: soft, NT Ext: benign tremor. Edema remains in B ankles and LEs Neuro: no deficits Psych: A&OX3  Lab Results:  Recent Labs    11/11/23 0558 11/12/23 0616  WBC 12.3* 7.5  HGB 14.2 13.1  HCT 43.0 38.7  PLT 259 234   BMET Recent Labs    11/11/23 0558 11/12/23 0616  NA 137 136  K 3.5 3.7  CL 107 102  CO2 22 25  GLUCOSE 111* 100*  BUN 16 11  CREATININE 0.92 0.82  CALCIUM  8.9 9.0   PT/INR No results for input(s): LABPROT, INR in the last 72 hours. CMP     Component Value Date/Time   NA 136 11/12/2023 0616   K 3.7 11/12/2023 0616   CL 102 11/12/2023 0616   CO2 25 11/12/2023 0616   GLUCOSE 100 (H) 11/12/2023 0616   BUN 11 11/12/2023 0616   CREATININE 0.82 11/12/2023 0616   CREATININE 0.92 02/07/2014 1219   CALCIUM  9.0 11/12/2023 0616   PROT 6.5 11/11/2023 0558   ALBUMIN 3.7 11/11/2023 0558   AST 19 11/11/2023 0558   ALT 8 11/11/2023 0558   ALKPHOS 109 11/11/2023 0558   BILITOT 0.7 11/11/2023 0558   GFRNONAA >60 11/12/2023 0616   GFRNONAA 62 02/07/2014 1219   GFRAA 62 (L) 06/17/2014 0957   GFRAA 71 02/07/2014 1219   Lipase  No results found for: LIPASE     Studies/Results: DG CHEST PORT 1 VIEW Result Date:  11/13/2023 CLINICAL DATA:  Left pleural effusion EXAM: PORTABLE CHEST - 1 VIEW COMPARISON:  the previous day's study FINDINGS: Multiple left rib fracture deformities. No pneumothorax. Small to moderate left pleural effusion. Stable right perihilar opacities. Heart size and mediastinal contours are within normal limits. Aortic Atherosclerosis (ICD10-170.0). Stable midthoracic dextroscoliosis. IMPRESSION: 1. Small to moderate left pleural effusion. 2. Multiple left rib fracture deformities. Electronically Signed   By: JONETTA Faes M.D.   On: 11/13/2023 08:40   DG CHEST PORT 1 VIEW Result Date: 11/12/2023 CLINICAL DATA:  Multiple left rib cage fractures. [id number]. EXAM: PORTABLE CHEST 1 VIEW COMPARISON:  Portable chest yesterday at 5:57 a.m., chest CT yesterday at 6:43 a.m. FINDINGS: 5:42 a.m. Multiple recent left rib cage fractures with displacement again noted. There is no measurable pneumothorax. There is interval increased left pleural effusion or hemothorax partially covering the left hemidiaphragm, with overlying patchy consolidation, atelectasis or contusive change in the left lower zone. The rest of the lungs are clear. There is mild cardiomegaly without evidence of CHF. The mediastinum is stable with aortic atherosclerosis and tortuosity. Moderate thoracic dextroscoliosis.  No new osseous abnormality. IMPRESSION: 1. Interval increased left pleural effusion or hemothorax partially covering the left hemidiaphragm, with overlying patchy consolidation, atelectasis or  contusive change in the left lower zone. 2. No measurable pneumothorax. 3. Multiple recent left rib cage fractures with displacement again noted. 4. Aortic atherosclerosis. Electronically Signed   By: Francis Quam M.D.   On: 11/12/2023 07:39    Anti-infectives: Anti-infectives (From admission, onward)    None        Assessment/Plan Fall Hygroma - NSGY, Dr. Debby following.  No acute intervention.  Follow up head CT in 3 months L rib  fxs 3-7 with tiny HPTX, increasing effusion - pain control, IS, pulm toilet, PT/OT, repeat CXR this am with increase in pleural effusion.  Repeat CXR this am looks stable.  Sating well on RA L comminuted scapula fx - per ortho, no intervention HTN - resume home med as well as hydrochlorothiazide  for LE edema, prn meds available as well Parkinson's disease - resume home med Hypothyroidism - resume home med HLD FEN - regular diet, SLIV VTE - Lovenox  ID - none Admit -therapies, SNF pending insurance auth.    I reviewed Consultant NSGY notes, last 24 h vitals and pain scores, last 48 h intake and output, last 24 h labs and trends, and last 24 h imaging results.   LOS: 2 days    Burnard FORBES Banter , [facility] 11/13/2023, 8:49 AM Please see Amion for pager number during day hours 7:00am-4:30pm or 7:00am -11:30am on weekends

## 2023-11-14 NOTE — Plan of Care (Signed)
  Problem: Education: Goal: Knowledge of General Education information will improve Description: Including pain rating scale, medication(s)/side effects and non-pharmacologic comfort measures 11/14/2023 0202 by Marvis Kenneth SAILOR, RN Outcome: Progressing 11/13/2023 1954 by Marvis Kenneth SAILOR, RN Outcome: Progressing   Problem: Health Behavior/Discharge Planning: Goal: Ability to manage health-related needs will improve 11/14/2023 0202 by Marvis Kenneth SAILOR, RN Outcome: Progressing 11/13/2023 1954 by Marvis Kenneth SAILOR, RN Outcome: Progressing   Problem: Clinical Measurements: Goal: Ability to maintain clinical measurements within normal limits will improve 11/14/2023 0202 by Marvis Kenneth SAILOR, RN Outcome: Progressing 11/13/2023 1954 by Marvis Kenneth SAILOR, RN Outcome: Progressing Goal: Will remain free from infection 11/14/2023 0202 by Marvis Kenneth SAILOR, RN Outcome: Progressing 11/13/2023 1954 by Marvis Kenneth SAILOR, RN Outcome: Progressing Goal: Diagnostic test results will improve 11/14/2023 0202 by Marvis Kenneth SAILOR, RN Outcome: Progressing 11/13/2023 1954 by Marvis Kenneth SAILOR, RN Outcome: Progressing Goal: Respiratory complications will improve 11/14/2023 0202 by Marvis Kenneth SAILOR, RN Outcome: Progressing 11/13/2023 1954 by Marvis Kenneth SAILOR, RN Outcome: Progressing Goal: Cardiovascular complication will be avoided 11/14/2023 0202 by Marvis Kenneth SAILOR, RN Outcome: Progressing 11/13/2023 1954 by Marvis Kenneth SAILOR, RN Outcome: Progressing   Problem: Activity: Goal: Risk for activity intolerance will decrease 11/14/2023 0202 by Marvis Kenneth SAILOR, RN Outcome: Progressing 11/13/2023 1954 by Marvis Kenneth SAILOR, RN Outcome: Progressing   Problem: Nutrition: Goal: Adequate nutrition will be maintained 11/14/2023 0202 by Marvis Kenneth SAILOR, RN Outcome: Progressing 11/13/2023 1954 by Marvis Kenneth SAILOR, RN Outcome: Progressing   Problem: Coping: Goal: Level of anxiety will  decrease 11/14/2023 0202 by Marvis Kenneth SAILOR, RN Outcome: Progressing 11/13/2023 1954 by Marvis Kenneth SAILOR, RN Outcome: Progressing   Problem: Elimination: Goal: Will not experience complications related to bowel motility 11/14/2023 0202 by Marvis Kenneth SAILOR, RN Outcome: Progressing 11/13/2023 1954 by Marvis Kenneth SAILOR, RN Outcome: Progressing Goal: Will not experience complications related to urinary retention 11/14/2023 0202 by Marvis Kenneth SAILOR, RN Outcome: Progressing 11/13/2023 1954 by Marvis Kenneth SAILOR, RN Outcome: Progressing   Problem: Pain Managment: Goal: General experience of comfort will improve and/or be controlled 11/14/2023 0202 by Marvis Kenneth SAILOR, RN Outcome: Progressing 11/13/2023 1954 by Marvis Kenneth SAILOR, RN Outcome: Progressing   Problem: Safety: Goal: Ability to remain free from injury will improve 11/14/2023 0202 by Marvis Kenneth SAILOR, RN Outcome: Progressing 11/13/2023 1954 by Marvis Kenneth SAILOR, RN Outcome: Progressing   Problem: Skin Integrity: Goal: Risk for impaired skin integrity will decrease 11/14/2023 0202 by Marvis Kenneth SAILOR, RN Outcome: Progressing 11/13/2023 1954 by Marvis Kenneth SAILOR, RN Outcome: Progressing

## 2023-11-14 NOTE — Plan of Care (Signed)

## 2023-11-14 NOTE — Progress Notes (Signed)
 Subjective: Tramadol  is now making her nauseated.  Pain seems well controlled otherwise.    ROS: See above, otherwise other systems negative  Objective: Vital signs in last 24 hours: Temp:  [97.4 F (36.3 C)-98.3 F (36.8 C)] 98.3 F (36.8 C) (07/27 0718) Pulse Rate:  [62-90] 90 (07/27 0718) Resp:  [12-17] 13 (07/27 0718) BP: (119-171)/(66-83) 171/78 (07/27 0718) SpO2:  [92 %-98 %] 92 % (07/27 0718) Last BM Date : 11/09/23  Intake/Output from previous day: 07/26 0701 - 07/27 0700 In: -  Out: 400 [Urine:400] Intake/Output this shift: No intake/output data recorded.  PE: Gen: NAD, laying in bed HEENT: PERRL Heart: regular Lungs: CTAB, some decrease BS at LLL Abd: soft, NT Ext: Edema  in B ankles and LEs Neuro: no deficits Psych: A&OX3  Lab Results:  Recent Labs    11/12/23 0616  WBC 7.5  HGB 13.1  HCT 38.7  PLT 234   BMET Recent Labs    11/12/23 0616  NA 136  K 3.7  CL 102  CO2 25  GLUCOSE 100*  BUN 11  CREATININE 0.82  CALCIUM  9.0   PT/INR No results for input(s): LABPROT, INR in the last 72 hours. CMP     Component Value Date/Time   NA 136 11/12/2023 0616   K 3.7 11/12/2023 0616   CL 102 11/12/2023 0616   CO2 25 11/12/2023 0616   GLUCOSE 100 (H) 11/12/2023 0616   BUN 11 11/12/2023 0616   CREATININE 0.82 11/12/2023 0616   CREATININE 0.92 02/07/2014 1219   CALCIUM  9.0 11/12/2023 0616   PROT 6.5 11/11/2023 0558   ALBUMIN 3.7 11/11/2023 0558   AST 19 11/11/2023 0558   ALT 8 11/11/2023 0558   ALKPHOS 109 11/11/2023 0558   BILITOT 0.7 11/11/2023 0558   GFRNONAA >60 11/12/2023 0616   GFRNONAA 62 02/07/2014 1219   GFRAA 62 (L) 06/17/2014 0957   GFRAA 71 02/07/2014 1219   Lipase  No results found for: LIPASE     Studies/Results: DG CHEST PORT 1 VIEW Result Date: 11/13/2023 CLINICAL DATA:  Left pleural effusion EXAM: PORTABLE CHEST - 1 VIEW COMPARISON:  the previous day's study FINDINGS: Multiple left rib fracture  deformities. No pneumothorax. Small to moderate left pleural effusion. Stable right perihilar opacities. Heart size and mediastinal contours are within normal limits. Aortic Atherosclerosis (ICD10-170.0). Stable midthoracic dextroscoliosis. IMPRESSION: 1. Small to moderate left pleural effusion. 2. Multiple left rib fracture deformities. Electronically Signed   By: JONETTA Faes M.D.   On: 11/13/2023 08:40    Anti-infectives: Anti-infectives (From admission, onward)    None        Assessment/Plan Fall Hygroma - NSGY, Dr. Debby following.  No acute intervention.  Follow up head CT in 3 months L rib fxs 3-7 with tiny HPTX, increasing effusion - pain control, IS, pulm toilet, PT/OT, repeat CXR this am with increase in pleural effusion.  Repeat CXR 7/26 looks stable.  Sating well on RA L comminuted scapula fx - per ortho, no intervention HTN - resume home med as well as hydrochlorothiazide  for LE edema, prn meds available as well Parkinson's disease - resume home med Hypothyroidism - resume home med HLD FEN - regular diet, SLIV VTE - Lovenox  ID - none Admit -therapies, SNF pending insurance auth.    I reviewed Consultant NSGY notes, last 24 h vitals and pain scores, last 48 h intake and output, last 24 h labs and trends, and last 24 h imaging results.  LOS: 3 days    Burnard FORBES Banter , [facility] 11/14/2023, 9:30 AM Please see Amion for pager number during day hours 7:00am-4:30pm or 7:00am -11:30am on weekends

## 2023-11-15 MED ORDER — ENSURE PLUS HIGH PROTEIN PO LIQD
237.0000 mL | Freq: Three times a day (TID) | ORAL | Status: DC
  Administered 2023-11-15 – 2023-11-17 (×6): 237 mL via ORAL

## 2023-11-15 NOTE — Progress Notes (Signed)
 Physical Therapy Treatment Patient Details Name: Amber Rosales MRN: [id number] DOB: 19-Jun-1940 Today's Date: 11/15/2023   History of Present Illness 83 yo female presents to ED 7/24 with fall at home. Pt sustained L 3-7 rib FX and small HPTX, L scapula FX,  R>L SD hygromas. PMH includes HLD, HTN, hypothyroidism, Anxiety, and Parkinson's disease.    PT Comments  Pt found on BSC at start of session and is agreeable to therapy. Pt requires mod-max assist for transfers and is hesitant to use LUE. Sit to stand requires assist to correct posterior lean at the sacrum and hips. Pt initiates steps with increased time but still has shuffling gait patterns. Pt progressing slowly. Patient will benefit from continued therapy to progress functional mobility.    If plan is discharge home, recommend the following: Two people to help with bathing/dressing/bathroom;Two people to help with walking and/or transfers;Assistance with cooking/housework   Can travel by private vehicle     No  Equipment Recommendations  None recommended by PT    Recommendations for Other Services       Precautions / Restrictions Precautions Precautions: Shoulder;Fall Type of Shoulder Precautions: LUE WBAT Recall of Precautions/Restrictions: Intact Restrictions Weight Bearing Restrictions Per Provider Order: No LUE Weight Bearing Per Provider Order: Weight bearing as tolerated     Mobility  Bed Mobility               General bed mobility comments: Pt found on BSC at start of session    Transfers   Equipment used: Rolling walker (2 wheels) Transfers: Sit to/from Stand, Bed to chair/wheelchair/BSC Sit to Stand: Max assist, Mod assist   Step pivot transfers: Max assist, Mod assist       General transfer comment: STSx3. Assist for power up at sacrum and trunk to rise. Needs steadying. Shuffling steps towards R to EOB. Max cueing for hand placement for RW    Ambulation/Gait Ambulation/Gait assistance: Max  assist Gait Distance (Feet): 5 Feet Assistive device: Rolling walker (2 wheels) Gait Pattern/deviations: Decreased stride length, Shuffle, Leaning posteriorly       General Gait Details: Cues to lean forward while standing and walking. Assist at sacrum/hips to correct posterior lean   Stairs             Wheelchair Mobility     Tilt Bed    Modified Rankin (Stroke Patients Only)       Balance Overall balance assessment: Needs assistance Sitting-balance support: Feet supported Sitting balance-Leahy Scale: Fair Sitting balance - Comments: Slight posterior lean but steady sitting EOB Postural control: Posterior lean Standing balance support: Bilateral upper extremity supported, Reliant on assistive device for balance Standing balance-Leahy Scale: Poor Standing balance comment: Posterior lean and needs assist to correct posture                            Communication    Cognition                                        Cueing    Exercises      General Comments        Pertinent Vitals/Pain Pain Assessment Pain Assessment: Faces Faces Pain Scale: Hurts a little bit Pain Location: L shoulder Pain Descriptors / Indicators: Aching, Grimacing Pain Intervention(s): Monitored during session, Limited activity within patient's tolerance    Home Living  Prior Function            PT Goals (current goals can now be found in the care plan section) Acute Rehab PT Goals Patient Stated Goal: get stronger PT Goal Formulation: With patient Time For Goal Achievement: 11/26/23 Potential to Achieve Goals: Fair Progress towards PT goals: Progressing toward goals    Frequency    Min 2X/week      PT Plan      Co-evaluation              AM-PAC PT 6 Clicks Mobility   Outcome Measure  Help needed turning from your back to your side while in a flat bed without using bedrails?: A Lot Help  needed moving from lying on your back to sitting on the side of a flat bed without using bedrails?: A Lot Help needed moving to and from a bed to a chair (including a wheelchair)?: A Lot Help needed standing up from a chair using your arms (e.g., wheelchair or bedside chair)?: A Lot Help needed to walk in hospital room?: A Lot Help needed climbing 3-5 steps with a railing? : Total 6 Click Score: 11    End of Session Equipment Utilized During Treatment: Gait belt Activity Tolerance: Patient limited by fatigue Patient left: with chair alarm set;in chair;with call bell/phone within reach Nurse Communication: Mobility status PT Visit Diagnosis: Other abnormalities of gait and mobility (R26.89);Muscle weakness (generalized) (M62.81);Pain Pain - Right/Left: Left Pain - part of body: Shoulder     Time: 8846-8783 PT Time Calculation (min) (ACUTE ONLY): 23 min  Charges:    $Therapeutic Activity: 8-22 mins PT General Charges $$ ACUTE PT VISIT: 1 Visit                     Quintin Campi, SPT  Acute Rehab  [phone]    Quintin Campi 11/15/2023, 2:54 PM

## 2023-11-15 NOTE — TOC Progression Note (Signed)
 Transition of Care [facility]) - Progression Note    Patient Details  Name: Amber Rosales MRN: [id number] Date of Birth: March 12, 1941  Transition of Care [facility]) CM/SW Contact  Lauraine FORBES Saa, LCSW Phone Number: 11/15/2023, 4:28 PM  Clinical Narrative:     4:28 PM CSW requested SNF decision update from [facility] admissions and has yet to receive a decision. CSW relayed information to patient and provided additional SNF options ([facility], [facility], [facility]) with their Medicare ratings. CSW requested second choice from patient is [facility] is unable to offer SNF bed. Patient stated that she would need to review additional SNF options with patient's son.  Expected Discharge Plan: Skilled Nursing Facility Barriers to Discharge: Insurance Authorization, Other (must enter comment) (SNF pending bed decision)               Expected Discharge Plan and Services In-house Referral: Clinical Social Work   Post Acute Care Choice: Skilled Nursing Facility Living arrangements for the past 2 months: Single Family Home                                       Social Drivers of Health (SDOH) Interventions SDOH Screenings   Food Insecurity: No Food Insecurity (11/11/2023)  Housing: Low Risk  (11/11/2023)  Transportation Needs: No Transportation Needs (11/11/2023)  Utilities: Not At Risk (11/11/2023)  Alcohol  Screen: Low Risk  (07/31/2021)  Depression (PHQ2-9): Low Risk  (07/31/2021)  Financial Resource Strain: Low Risk  (07/31/2021)  Physical Activity: Inactive (07/31/2021)  Social Connections: Moderately Integrated (11/11/2023)  Stress: No Stress Concern Present (07/31/2021)  Tobacco Use: Medium Risk (11/11/2023)    Readmission Risk Interventions     No data to display

## 2023-11-15 NOTE — Plan of Care (Signed)

## 2023-11-15 NOTE — Progress Notes (Signed)
       Subjective: Nausea is a bit better.  Drinking the Ensure and liking it.  Eating small amounts otherwise.  Pain seems well controlled otherwise.  ROS: See above, otherwise other systems negative  Objective: Vital signs in last 24 hours: Temp:  [97.7 F (36.5 C)-98.6 F (37 C)] 98.4 F (36.9 C) (07/28 0740) Pulse Rate:  [67-86] 80 (07/28 0832) Resp:  [14-18] 16 (07/28 0832) BP: (143-168)/(75-94) 143/84 (07/28 0832) SpO2:  [92 %-98 %] 95 % (07/28 0832) Last BM Date : 11/09/23  Intake/Output from previous day: No intake/output data recorded. Intake/Output this shift: No intake/output data recorded.  PE: Gen: NAD, laying in bed HEENT: PERRL Heart: regular Lungs: CTAB, some decrease BS at LLL Abd: soft, NT Ext: Edema  in B ankles and LEs Neuro: no deficits Psych: A&OX3  Lab Results:  No results for input(s): WBC, HGB, HCT, PLT in the last 72 hours.  BMET No results for input(s): NA, K, CL, CO2, GLUCOSE, BUN, CREATININE, CALCIUM  in the last 72 hours.  PT/INR No results for input(s): LABPROT, INR in the last 72 hours. CMP     Component Value Date/Time   NA 136 11/12/2023 0616   K 3.7 11/12/2023 0616   CL 102 11/12/2023 0616   CO2 25 11/12/2023 0616   GLUCOSE 100 (H) 11/12/2023 0616   BUN 11 11/12/2023 0616   CREATININE 0.82 11/12/2023 0616   CREATININE 0.92 02/07/2014 1219   CALCIUM  9.0 11/12/2023 0616   PROT 6.5 11/11/2023 0558   ALBUMIN 3.7 11/11/2023 0558   AST 19 11/11/2023 0558   ALT 8 11/11/2023 0558   ALKPHOS 109 11/11/2023 0558   BILITOT 0.7 11/11/2023 0558   GFRNONAA >60 11/12/2023 0616   GFRNONAA 62 02/07/2014 1219   GFRAA 62 (L) 06/17/2014 0957   GFRAA 71 02/07/2014 1219   Lipase  No results found for: LIPASE     Studies/Results: No results found.   Anti-infectives: Anti-infectives (From admission, onward)    None        Assessment/Plan Fall Hygroma - NSGY, Dr. Debby following.  No acute  intervention.  Follow up head CT in 3 months L rib fxs 3-7 with tiny HPTX, increasing effusion - pain control, IS, pulm toilet, PT/OT, repeat CXR this am with increase in pleural effusion.  Repeat CXR 7/26 looks stable.  Sating well on RA L comminuted scapula fx - per ortho, no intervention HTN - resume home med as well as hydrochlorothiazide  for LE edema, prn meds available as well Parkinson's disease - resume home med Hypothyroidism - resume home med HLD FEN - regular diet, SLIV VTE - Lovenox  ID - none Admit -therapies, needs to pick SNF and start auth    I reviewed Consultant NSGY notes, last 24 h vitals and pain scores, last 48 h intake and output, last 24 h labs and trends, and last 24 h imaging results.   LOS: 4 days    Burnard FORBES Banter , [facility] 11/15/2023, 10:32 AM Please see Amion for pager number during day hours 7:00am-4:30pm or 7:00am -11:30am on weekends

## 2023-11-15 NOTE — Progress Notes (Signed)
   11/15/23 1100  Spiritual Encounters  Type of Visit Initial  Care provided to: Patient  Referral source Nurse (RN/NT/LPN)  Reason for visit Routine spiritual support  OnCall Visit No    Chaplain responded to consult request for prayer. The patient shared that she fell while walking. She had broken bones.   The patient mentioned that her husband passed away in June 07, 2023. He was the [place] in the [employer]. She also had many volunteers work in [employer]. Now she is waiting for the healing touch.   Chaplain was present, acknowledged the pain, provided emotional and spiritual support, and offered a prayer for healing.   M.Kubra Susanna Kerry Resident [phone]

## 2023-11-15 NOTE — Plan of Care (Signed)
  Problem: Education: Goal: Knowledge of General Education information will improve Description: Including pain rating scale, medication(s)/side effects and non-pharmacologic comfort measures 11/15/2023 0115 by Marvis Kenneth SAILOR, RN Outcome: Progressing 11/14/2023 1956 by Marvis Kenneth SAILOR, RN Outcome: Progressing   Problem: Health Behavior/Discharge Planning: Goal: Ability to manage health-related needs will improve 11/15/2023 0115 by Marvis Kenneth SAILOR, RN Outcome: Progressing 11/14/2023 1956 by Marvis Kenneth SAILOR, RN Outcome: Progressing   Problem: Clinical Measurements: Goal: Ability to maintain clinical measurements within normal limits will improve 11/15/2023 0115 by Marvis Kenneth SAILOR, RN Outcome: Progressing 11/14/2023 1956 by Marvis Kenneth SAILOR, RN Outcome: Progressing Goal: Will remain free from infection 11/15/2023 0115 by Marvis Kenneth SAILOR, RN Outcome: Progressing 11/14/2023 1956 by Marvis Kenneth SAILOR, RN Outcome: Progressing Goal: Diagnostic test results will improve 11/15/2023 0115 by Marvis Kenneth SAILOR, RN Outcome: Progressing 11/14/2023 1956 by Marvis Kenneth SAILOR, RN Outcome: Progressing Goal: Respiratory complications will improve 11/15/2023 0115 by Marvis Kenneth SAILOR, RN Outcome: Progressing 11/14/2023 1956 by Marvis Kenneth SAILOR, RN Outcome: Progressing Goal: Cardiovascular complication will be avoided 11/15/2023 0115 by Marvis Kenneth SAILOR, RN Outcome: Progressing 11/14/2023 1956 by Marvis Kenneth SAILOR, RN Outcome: Progressing   Problem: Activity: Goal: Risk for activity intolerance will decrease 11/15/2023 0115 by Marvis Kenneth SAILOR, RN Outcome: Progressing 11/14/2023 1956 by Marvis Kenneth SAILOR, RN Outcome: Progressing   Problem: Nutrition: Goal: Adequate nutrition will be maintained 11/15/2023 0115 by Marvis Kenneth SAILOR, RN Outcome: Progressing 11/14/2023 1956 by Marvis Kenneth SAILOR, RN Outcome: Progressing   Problem: Coping: Goal: Level of anxiety will  decrease 11/15/2023 0115 by Marvis Kenneth SAILOR, RN Outcome: Progressing 11/14/2023 1956 by Marvis Kenneth SAILOR, RN Outcome: Progressing   Problem: Elimination: Goal: Will not experience complications related to bowel motility 11/15/2023 0115 by Marvis Kenneth SAILOR, RN Outcome: Progressing 11/14/2023 1956 by Marvis Kenneth SAILOR, RN Outcome: Progressing Goal: Will not experience complications related to urinary retention 11/15/2023 0115 by Marvis Kenneth SAILOR, RN Outcome: Progressing 11/14/2023 1956 by Marvis Kenneth SAILOR, RN Outcome: Progressing   Problem: Pain Managment: Goal: General experience of comfort will improve and/or be controlled 11/15/2023 0115 by Marvis Kenneth SAILOR, RN Outcome: Progressing 11/14/2023 1956 by Marvis Kenneth SAILOR, RN Outcome: Progressing   Problem: Safety: Goal: Ability to remain free from injury will improve 11/15/2023 0115 by Marvis Kenneth SAILOR, RN Outcome: Progressing 11/14/2023 1956 by Marvis Kenneth SAILOR, RN Outcome: Progressing   Problem: Skin Integrity: Goal: Risk for impaired skin integrity will decrease 11/15/2023 0115 by Marvis Kenneth SAILOR, RN Outcome: Progressing 11/14/2023 1956 by Marvis Kenneth SAILOR, RN Outcome: Progressing

## 2023-11-16 MED ORDER — POLYETHYLENE GLYCOL 3350 17 G PO PACK
17.0000 g | PACK | Freq: Two times a day (BID) | ORAL | Status: DC
  Administered 2023-11-16 – 2023-11-17 (×2): 17 g via ORAL
  Filled 2023-11-16 (×2): qty 1

## 2023-11-16 NOTE — Progress Notes (Signed)
       Subjective: Nausea is better.  Having some palpitations.  Eating better.  No BM  ROS: See above, otherwise other systems negative  Objective: Vital signs in last 24 hours: Temp:  [96.9 F (36.1 C)-98.2 F (36.8 C)] 97.8 F (36.6 C) (07/29 0745) Pulse Rate:  [70-100] 100 (07/29 0745) Resp:  [14-19] 19 (07/29 0745) BP: (118-144)/(50-76) 139/51 (07/29 0745) SpO2:  [89 %-98 %] 90 % (07/29 0745) Last BM Date : 11/09/23  Intake/Output from previous day: 07/28 0701 - 07/29 0700 In: -  Out: 450 [Urine:450] Intake/Output this shift: No intake/output data recorded.  PE: Gen: NAD, up in a chair HEENT: PERRL Heart: regular Lungs: CTAB, some decrease BS at LLL Abd: soft, NT Ext: MAE Neuro: no deficits Psych: A&OX3  Lab Results:  No results for input(s): WBC, HGB, HCT, PLT in the last 72 hours.  BMET No results for input(s): NA, K, CL, CO2, GLUCOSE, BUN, CREATININE, CALCIUM  in the last 72 hours.  PT/INR No results for input(s): LABPROT, INR in the last 72 hours. CMP     Component Value Date/Time   NA 136 11/12/2023 0616   K 3.7 11/12/2023 0616   CL 102 11/12/2023 0616   CO2 25 11/12/2023 0616   GLUCOSE 100 (H) 11/12/2023 0616   BUN 11 11/12/2023 0616   CREATININE 0.82 11/12/2023 0616   CREATININE 0.92 02/07/2014 1219   CALCIUM  9.0 11/12/2023 0616   PROT 6.5 11/11/2023 0558   ALBUMIN 3.7 11/11/2023 0558   AST 19 11/11/2023 0558   ALT 8 11/11/2023 0558   ALKPHOS 109 11/11/2023 0558   BILITOT 0.7 11/11/2023 0558   GFRNONAA >60 11/12/2023 0616   GFRNONAA 62 02/07/2014 1219   GFRAA 62 (L) 06/17/2014 0957   GFRAA 71 02/07/2014 1219   Lipase  No results found for: LIPASE     Studies/Results: No results found.   Anti-infectives: Anti-infectives (From admission, onward)    None        Assessment/Plan Fall Hygroma - NSGY, Dr. Debby following.  No acute intervention.  Follow up head CT in 3 months L rib fxs 3-7  with tiny HPTX, increasing effusion - pain control, IS, pulm toilet, PT/OT, repeat CXR this am with increase in pleural effusion.  Repeat CXR 7/26 looks stable.  Sating well on RA L comminuted scapula fx - per ortho, no intervention HTN - resume home med as well as hydrochlorothiazide  for LE edema, prn meds available as well Parkinson's disease - resume home med Hypothyroidism - resume home med HLD FEN - regular diet, SLIV VTE - Lovenox  ID - none Admit -therapies, SNF, auth pending    I reviewed Consultant NSGY notes, last 24 h vitals and pain scores, last 48 h intake and output, last 24 h labs and trends, and last 24 h imaging results.   LOS: 5 days    Burnard FORBES Banter , [facility] 11/16/2023, 10:39 AM Please see Amion for pager number during day hours 7:00am-4:30pm or 7:00am -11:30am on weekends

## 2023-11-16 NOTE — Progress Notes (Signed)
 Occupational Therapy Treatment Patient Details Name: Amber Rosales MRN: [id number] DOB: 1941/03/26 Today's Date: 11/16/2023   History of present illness 83 yo female presents to ED 7/24 with fall at home. Pt sustained L 3-7 rib FX and small HPTX, L scapula FX,  R>L SD hygromas. PMH includes HLD, HTN, hypothyroidism, Anxiety, and Parkinson's disease.   OT comments  Patient requesting bedside commode.  Mod A for sit to stand and Min A for stand pivot, posterior lean in standing.  Patient needing total assist for hygiene due to balance.  OT will continue efforts in the acute setting and Patient will benefit from continued inpatient follow up therapy, <3 hours/day.      If plan is discharge home, recommend the following:  Assist for transportation;Assistance with cooking/housework;A lot of help with walking and/or transfers;A lot of help with bathing/dressing/bathroom   Equipment Recommendations  None recommended by OT    Recommendations for Other Services      Precautions / Restrictions Precautions Precautions: Fall Recall of Precautions/Restrictions: Intact Restrictions Weight Bearing Restrictions Per Provider Order: No       Mobility Bed Mobility               General bed mobility comments: Pt found on BSC at start of session    Transfers Overall transfer level: Needs assistance Equipment used: Rolling walker (2 wheels) Transfers: Sit to/from Stand, Bed to chair/wheelchair/BSC Sit to Stand: Mod assist Stand pivot transfers: Min assist, Mod assist               Balance Overall balance assessment: Needs assistance Sitting-balance support: Feet supported Sitting balance-Leahy Scale: Fair     Standing balance support: Reliant on assistive device for balance Standing balance-Leahy Scale: Poor Standing balance comment: Posterior lean and needs assist to correct posture                           ADL either performed or assessed with clinical judgement    ADL   Eating/Feeding: Set up;Sitting   Grooming: Wash/dry hands;Wash/dry face;Minimal assistance;Sitting           Upper Body Dressing : Sitting;Moderate assistance   Lower Body Dressing: Maximal assistance;Sit to/from stand   Toilet Transfer: Minimal assistance;Stand-pivot;BSC/3in1;Rolling walker (2 wheels)   Toileting- Clothing Manipulation and Hygiene: Total assistance;Sit to/from stand              Extremity/Trunk Assessment Upper Extremity Assessment Upper Extremity Assessment: Generalized weakness RUE Deficits / Details: Arthritic hands LUE Deficits / Details: non operative scapular fx.  Arthritic hands LUE Sensation: WNL LUE Coordination: decreased fine motor   Lower Extremity Assessment Lower Extremity Assessment: Defer to PT evaluation   Cervical / Trunk Assessment Cervical / Trunk Assessment: Kyphotic    Vision Patient Visual Report: No change from baseline     Perception Perception Perception: Not tested   Praxis Praxis Praxis: Not tested   Communication Communication Communication: No apparent difficulties   Cognition Arousal: Alert Behavior During Therapy: Flat affect Cognition: Cognition impaired       Memory impairment (select all impairments): Short-term memory Attention impairment (select first level of impairment): Selective attention                     Following commands: Intact        Cueing   Cueing Techniques: Verbal cues, Gestural cues, Tactile cues  Exercises      Shoulder Instructions  General Comments  VSS on RA    Pertinent Vitals/ Pain       Pain Assessment Pain Assessment: Faces Faces Pain Scale: Hurts a little bit Pain Location: L shoulder Pain Descriptors / Indicators: Tender Pain Intervention(s): Monitored during session                                                          Frequency  Min 2X/week        Progress Toward Goals  OT Goals(current goals  can now be found in the care plan section)  Progress towards OT goals: Progressing toward goals  Acute Rehab OT Goals OT Goal Formulation: With patient Time For Goal Achievement: 11/26/23 Potential to Achieve Goals: Fair  Plan      Co-evaluation                 AM-PAC OT 6 Clicks Daily Activity     Outcome Measure   Help from another person eating meals?: A Little Help from another person taking care of personal grooming?: A Little Help from another person toileting, which includes using toliet, bedpan, or urinal?: A Lot Help from another person bathing (including washing, rinsing, drying)?: A Lot Help from another person to put on and taking off regular upper body clothing?: A Lot Help from another person to put on and taking off regular lower body clothing?: A Lot 6 Click Score: 14    End of Session Equipment Utilized During Treatment: Rolling walker (2 wheels)  OT Visit Diagnosis: Unsteadiness on feet (R26.81);History of falling (Z91.81);Muscle weakness (generalized) (M62.81);Pain Pain - Right/Left: Left Pain - part of body: Shoulder   Activity Tolerance Patient tolerated treatment well   Patient Left in chair;with call bell/phone within reach;with chair alarm set   Nurse Communication          Time: 8958-8897 OT Time Calculation (min): 21 min  Charges: OT General Charges $OT Visit: 1 Visit OT Treatments $Self Care/Home Management : 8-22 mins  11/16/2023  RP, OTR/L  Acute Rehabilitation Services  Office:  [phone]   Charlie JONETTA Halsted 11/16/2023, 11:20 AM

## 2023-11-16 NOTE — Plan of Care (Signed)
  Problem: Education: Goal: Knowledge of General Education information will improve Description: Including pain rating scale, medication(s)/side effects and non-pharmacologic comfort measures 11/16/2023 0051 by Marvis Kenneth SAILOR, RN Outcome: Progressing 11/15/2023 1929 by Marvis Kenneth SAILOR, RN Outcome: Progressing   Problem: Health Behavior/Discharge Planning: Goal: Ability to manage health-related needs will improve 11/16/2023 0051 by Marvis Kenneth SAILOR, RN Outcome: Progressing 11/15/2023 1929 by Marvis Kenneth SAILOR, RN Outcome: Progressing   Problem: Clinical Measurements: Goal: Ability to maintain clinical measurements within normal limits will improve 11/16/2023 0051 by Marvis Kenneth SAILOR, RN Outcome: Progressing 11/15/2023 1929 by Marvis Kenneth SAILOR, RN Outcome: Progressing Goal: Will remain free from infection 11/16/2023 0051 by Marvis Kenneth SAILOR, RN Outcome: Progressing 11/15/2023 1929 by Marvis Kenneth SAILOR, RN Outcome: Progressing Goal: Diagnostic test results will improve 11/16/2023 0051 by Marvis Kenneth SAILOR, RN Outcome: Progressing 11/15/2023 1929 by Marvis Kenneth SAILOR, RN Outcome: Progressing Goal: Respiratory complications will improve 11/16/2023 0051 by Marvis Kenneth SAILOR, RN Outcome: Progressing 11/15/2023 1929 by Marvis Kenneth SAILOR, RN Outcome: Progressing Goal: Cardiovascular complication will be avoided 11/16/2023 0051 by Marvis Kenneth SAILOR, RN Outcome: Progressing 11/15/2023 1929 by Marvis Kenneth SAILOR, RN Outcome: Progressing   Problem: Activity: Goal: Risk for activity intolerance will decrease 11/16/2023 0051 by Marvis Kenneth SAILOR, RN Outcome: Progressing 11/15/2023 1929 by Marvis Kenneth SAILOR, RN Outcome: Progressing   Problem: Nutrition: Goal: Adequate nutrition will be maintained 11/16/2023 0051 by Marvis Kenneth SAILOR, RN Outcome: Progressing 11/15/2023 1929 by Marvis Kenneth SAILOR, RN Outcome: Progressing   Problem: Coping: Goal: Level of anxiety will  decrease 11/16/2023 0051 by Marvis Kenneth SAILOR, RN Outcome: Progressing 11/15/2023 1929 by Marvis Kenneth SAILOR, RN Outcome: Progressing   Problem: Elimination: Goal: Will not experience complications related to bowel motility 11/16/2023 0051 by Marvis Kenneth SAILOR, RN Outcome: Progressing 11/15/2023 1929 by Marvis Kenneth SAILOR, RN Outcome: Progressing Goal: Will not experience complications related to urinary retention 11/16/2023 0051 by Marvis Kenneth SAILOR, RN Outcome: Progressing 11/15/2023 1929 by Marvis Kenneth SAILOR, RN Outcome: Progressing   Problem: Pain Managment: Goal: General experience of comfort will improve and/or be controlled 11/16/2023 0051 by Marvis Kenneth SAILOR, RN Outcome: Progressing 11/15/2023 1929 by Marvis Kenneth SAILOR, RN Outcome: Progressing   Problem: Safety: Goal: Ability to remain free from injury will improve 11/16/2023 0051 by Marvis Kenneth SAILOR, RN Outcome: Progressing 11/15/2023 1929 by Marvis Kenneth SAILOR, RN Outcome: Progressing   Problem: Skin Integrity: Goal: Risk for impaired skin integrity will decrease 11/16/2023 0051 by Marvis Kenneth SAILOR, RN Outcome: Progressing 11/15/2023 1929 by Marvis Kenneth SAILOR, RN Outcome: Progressing

## 2023-11-16 NOTE — TOC Progression Note (Signed)
 Transition of Care [facility]) - Progression Note    Patient Details  Name: Amber Rosales MRN: [id number] Date of Birth: [birth year]  Transition of Care [facility]) CM/SW Contact  Montie LOISE Louder, [place] Phone Number: 11/16/2023, 1:04 PM  Clinical Narrative:     11:30 am -  met with patient provided bed offers- patient states her son was at work but requested CSW talk with him about rehab choice.  She states her son was coming to visit.   1:05- spoke with patient's son by phone- he states his preferred choice is [facility]. He states they have family members there and he knows the patient would be more comfortable there.   1:07- spoke with Tammy , informed family wants their SNF. She states she will review - waiting on response  TOC will continue to follow and assist with discharge planning.  Montie Louder, MSW, LCSW Clinical Social Worker      Expected Discharge Plan: Skilled Nursing Facility Barriers to Discharge: Insurance Authorization, Other (must enter comment) (SNF pending bed decision)               Expected Discharge Plan and Services In-house Referral: Clinical Social Work   Post Acute Care Choice: Skilled Nursing Facility Living arrangements for the past 2 months: Single Family Home                                       Social Drivers of Health (SDOH) Interventions SDOH Screenings   Food Insecurity: No Food Insecurity (11/11/2023)  Housing: Low Risk  (11/11/2023)  Transportation Needs: No Transportation Needs (11/11/2023)  Utilities: Not At Risk (11/11/2023)  Alcohol  Screen: Low Risk  (07/31/2021)  Depression (PHQ2-9): Low Risk  (07/31/2021)  Financial Resource Strain: Low Risk  (07/31/2021)  Physical Activity: Inactive (07/31/2021)  Social Connections: Moderately Integrated (11/11/2023)  Stress: No Stress Concern Present (07/31/2021)  Tobacco Use: Medium Risk (11/11/2023)    Readmission Risk Interventions     No data to display

## 2023-11-16 NOTE — TOC Progression Note (Signed)
 Transition of Care [facility]) - Progression Note    Patient Details  Name: Amber Rosales MRN: [id number] Date of Birth: 04-23-1940  Transition of Care [facility]) CM/SW Contact  Lauraine FORBES Saa, LCSW Phone Number: 11/16/2023, 3:11 PM  Clinical Narrative:     3:11 PM Per chart review, [facility] SNF extended bed offer to patient. CMA submitted SNF insurance authorization (ID [id number]) which was approved and is valid 11/16/23-11/18/23.  Expected Discharge Plan: Skilled Nursing Facility Barriers to Discharge: Insurance Authorization, Other (must enter comment) (SNF pending bed decision)               Expected Discharge Plan and Services In-house Referral: Clinical Social Work   Post Acute Care Choice: Skilled Nursing Facility Living arrangements for the past 2 months: Single Family Home                                       Social Drivers of Health (SDOH) Interventions SDOH Screenings   Food Insecurity: No Food Insecurity (11/11/2023)  Housing: Low Risk  (11/11/2023)  Transportation Needs: No Transportation Needs (11/11/2023)  Utilities: Not At Risk (11/11/2023)  Alcohol  Screen: Low Risk  (07/31/2021)  Depression (PHQ2-9): Low Risk  (07/31/2021)  Financial Resource Strain: Low Risk  (07/31/2021)  Physical Activity: Inactive (07/31/2021)  Social Connections: Moderately Integrated (11/11/2023)  Stress: No Stress Concern Present (07/31/2021)  Tobacco Use: Medium Risk (11/11/2023)    Readmission Risk Interventions     No data to display

## 2023-11-17 DIAGNOSIS — F321 Major depressive disorder, single episode, moderate: Secondary | ICD-10-CM | POA: Diagnosis not present

## 2023-11-17 DIAGNOSIS — Z7401 Bed confinement status: Secondary | ICD-10-CM | POA: Diagnosis not present

## 2023-11-17 DIAGNOSIS — E039 Hypothyroidism, unspecified: Secondary | ICD-10-CM | POA: Diagnosis not present

## 2023-11-17 DIAGNOSIS — R29818 Other symptoms and signs involving the nervous system: Secondary | ICD-10-CM | POA: Diagnosis not present

## 2023-11-17 DIAGNOSIS — S42102A Fracture of unspecified part of scapula, left shoulder, initial encounter for closed fracture: Secondary | ICD-10-CM | POA: Diagnosis not present

## 2023-11-17 DIAGNOSIS — G20C Parkinsonism, unspecified: Secondary | ICD-10-CM | POA: Diagnosis not present

## 2023-11-17 DIAGNOSIS — S065X0D Traumatic subdural hemorrhage without loss of consciousness, subsequent encounter: Secondary | ICD-10-CM | POA: Diagnosis not present

## 2023-11-17 DIAGNOSIS — S2242XG Multiple fractures of ribs, left side, subsequent encounter for fracture with delayed healing: Secondary | ICD-10-CM | POA: Diagnosis not present

## 2023-11-17 DIAGNOSIS — E785 Hyperlipidemia, unspecified: Secondary | ICD-10-CM | POA: Diagnosis not present

## 2023-11-17 DIAGNOSIS — S42102D Fracture of unspecified part of scapula, left shoulder, subsequent encounter for fracture with routine healing: Secondary | ICD-10-CM | POA: Diagnosis not present

## 2023-11-17 DIAGNOSIS — E038 Other specified hypothyroidism: Secondary | ICD-10-CM | POA: Diagnosis not present

## 2023-11-17 DIAGNOSIS — S42192A Fracture of other part of scapula, left shoulder, initial encounter for closed fracture: Secondary | ICD-10-CM | POA: Diagnosis not present

## 2023-11-17 DIAGNOSIS — G20A1 Parkinson's disease without dyskinesia, without mention of fluctuations: Secondary | ICD-10-CM | POA: Diagnosis not present

## 2023-11-17 DIAGNOSIS — I10 Essential (primary) hypertension: Secondary | ICD-10-CM | POA: Diagnosis not present

## 2023-11-17 DIAGNOSIS — Z23 Encounter for immunization: Secondary | ICD-10-CM | POA: Diagnosis not present

## 2023-11-17 DIAGNOSIS — S4990XA Unspecified injury of shoulder and upper arm, unspecified arm, initial encounter: Secondary | ICD-10-CM | POA: Diagnosis not present

## 2023-11-17 DIAGNOSIS — S2242XD Multiple fractures of ribs, left side, subsequent encounter for fracture with routine healing: Secondary | ICD-10-CM | POA: Diagnosis not present

## 2023-11-17 DIAGNOSIS — K5909 Other constipation: Secondary | ICD-10-CM | POA: Diagnosis not present

## 2023-11-17 DIAGNOSIS — R4189 Other symptoms and signs involving cognitive functions and awareness: Secondary | ICD-10-CM | POA: Diagnosis not present

## 2023-11-17 DIAGNOSIS — S2242XA Multiple fractures of ribs, left side, initial encounter for closed fracture: Secondary | ICD-10-CM | POA: Diagnosis not present

## 2023-11-17 DIAGNOSIS — D181 Lymphangioma, any site: Secondary | ICD-10-CM | POA: Diagnosis not present

## 2023-11-17 DIAGNOSIS — R41841 Cognitive communication deficit: Secondary | ICD-10-CM | POA: Diagnosis not present

## 2023-11-17 DIAGNOSIS — G9609 Other spinal cerebrospinal fluid leak: Secondary | ICD-10-CM | POA: Diagnosis not present

## 2023-11-17 DIAGNOSIS — R0782 Intercostal pain: Secondary | ICD-10-CM | POA: Diagnosis not present

## 2023-11-17 DIAGNOSIS — M6281 Muscle weakness (generalized): Secondary | ICD-10-CM | POA: Diagnosis not present

## 2023-11-17 MED ORDER — TRAMADOL HCL 50 MG PO TABS: 50.0000 mg | ORAL_TABLET | Freq: Four times a day (QID) | ORAL | 0 refills | Status: DC | PRN

## 2023-11-17 MED ORDER — ACETAMINOPHEN 500 MG PO TABS: 1000.0000 mg | ORAL_TABLET | Freq: Four times a day (QID) | ORAL | Status: DC

## 2023-11-17 NOTE — Progress Notes (Signed)
 Discharging to SNF- TOC working on bed offers and transport

## 2023-11-17 NOTE — Progress Notes (Signed)
 Physical Therapy Treatment Patient Details Name: Amber Rosales MRN: [id number] DOB: 08/07/1940 Today's Date: 11/17/2023   History of Present Illness 83 yo female presents to ED 7/24 with fall at home. Pt sustained L 3-7 rib FX and small HPTX, L scapula FX,  R>L SD hygromas. PMH includes HLD, HTN, hypothyroidism, Anxiety, and Parkinson's disease.    PT Comments  The pt is making good, gradual functional progress as she was able to ambulate an increased distance of up to ~10 ft this date. However, she needs multi-modal cues and mod-maxA to sequence and complete scooting anteriorly/posteriorly on seat, transferring to stand, and ambulating. She is at high risk for falls. Performed seated AAROM of her shoulders to tolerance to maintain/recover ROM and seated LAQs to activate her quads prior to mobilizing. Will continue to follow acutely.    If plan is discharge home, recommend the following: Two people to help with bathing/dressing/bathroom;Two people to help with walking and/or transfers;Assistance with cooking/housework   Can travel by private vehicle     No  Equipment Recommendations  None recommended by PT    Recommendations for Other Services       Precautions / Restrictions Precautions Precautions: Fall Type of Shoulder Precautions: LUE WBAT Recall of Precautions/Restrictions: Intact Restrictions Weight Bearing Restrictions Per Provider Order: No LUE Weight Bearing Per Provider Order: Weight bearing as tolerated     Mobility  Bed Mobility               General bed mobility comments: Pt sitting in chair upon arrival and at end of session    Transfers Overall transfer level: Needs assistance Equipment used: Rolling walker (2 wheels) Transfers: Sit to/from Stand Sit to Stand: Mod assist, Max assist           General transfer comment: Pt needed step-by-step multi-modal cues to sequence lateral weight shifting and scoot contralateral hip to get to edge of seat.  Repeated cues needed to place R hand on armrest of chair with L hand on RW for transfers. Pt stood 3x from recliner to RW with mod-maxA to power up to stand and gain balance, posterior lean noted initially.    Ambulation/Gait Ambulation/Gait assistance: Mod assist, Max assist Gait Distance (Feet): 10 Feet Assistive device: Rolling walker (2 wheels) Gait Pattern/deviations: Decreased stride length, Shuffle, Leaning posteriorly, Decreased step length - right, Decreased step length - left Gait velocity: reduced Gait velocity interpretation: <1.31 ft/sec, indicative of household ambulator   General Gait Details: Pt takes slow, small, unsteady, shuffling steps and maintains a posterior lean. Mod-maxA needed for balance, cuing to advance feet, and RW progression to guide pt when ambulating   Stairs             Wheelchair Mobility     Tilt Bed    Modified Rankin (Stroke Patients Only)       Balance Overall balance assessment: Needs assistance Sitting-balance support: No upper extremity supported, Feet supported Sitting balance-Leahy Scale: Fair   Postural control: Posterior lean Standing balance support: Bilateral upper extremity supported, During functional activity, Reliant on assistive device for balance Standing balance-Leahy Scale: Poor Standing balance comment: posterior lean noted, RW support and mod-maxA for balance                            Communication Communication Communication: No apparent difficulties  Cognition Arousal: Alert Behavior During Therapy: Flat affect   PT - Cognitive impairments: Sequencing  PT - Cognition Comments: needs cues for sequencing scooting, transfers, and gait Following commands: Impaired Following commands impaired: Follows multi-step commands with increased time    Cueing Cueing Techniques: Verbal cues, Tactile cues  Exercises General Exercises - Upper Extremity Shoulder Flexion:  AAROM, Both, 10 reps, Seated Shoulder ABduction: AAROM, Both, 10 reps, Seated General Exercises - Lower Extremity Long Arc Quad: AROM, Strengthening, Both, 10 reps, Seated    General Comments        Pertinent Vitals/Pain Pain Assessment Pain Assessment: Faces Faces Pain Scale: Hurts little more Pain Location: L shoulder Pain Descriptors / Indicators: Discomfort, Grimacing, Guarding Pain Intervention(s): Monitored during session, Limited activity within patient's tolerance, Repositioned    Home Living                          Prior Function            PT Goals (current goals can now be found in the care plan section) Acute Rehab PT Goals Patient Stated Goal: get stronger PT Goal Formulation: With patient Time For Goal Achievement: 11/26/23 Potential to Achieve Goals: Fair Progress towards PT goals: Progressing toward goals    Frequency    Min 2X/week      PT Plan      Co-evaluation              AM-PAC PT 6 Clicks Mobility   Outcome Measure  Help needed turning from your back to your side while in a flat bed without using bedrails?: A Lot Help needed moving from lying on your back to sitting on the side of a flat bed without using bedrails?: A Lot Help needed moving to and from a bed to a chair (including a wheelchair)?: A Lot Help needed standing up from a chair using your arms (e.g., wheelchair or bedside chair)?: A Lot Help needed to walk in hospital room?: Total (<20 ft) Help needed climbing 3-5 steps with a railing? : Total 6 Click Score: 10    End of Session Equipment Utilized During Treatment: Gait belt Activity Tolerance: Patient limited by fatigue Patient left: with chair alarm set;in chair;with call bell/phone within reach   PT Visit Diagnosis: Other abnormalities of gait and mobility (R26.89);Muscle weakness (generalized) (M62.81);Pain;Unsteadiness on feet (R26.81) Pain - Right/Left: Left Pain - part of body: Shoulder      Time: 9067-9055 PT Time Calculation (min) (ACUTE ONLY): 12 min  Charges:    $Therapeutic Activity: 8-22 mins PT General Charges $$ ACUTE PT VISIT: 1 Visit                     Theo Ferretti, PT, DPT Acute Rehabilitation Services  Office: [phone]    Theo CHRISTELLA Ferretti 11/17/2023, 10:58 AM

## 2023-11-17 NOTE — Discharge Summary (Signed)
 Patient ID: Amber Rosales [id number] Feb 19, 1941 83 y.o.  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 Debby, NSGY Dr. Kendal, ortho  Reason for Admission: This is an 83 yo white female with a history of HLD, HTN, hypothyroidism, Anxiety, and Parkinson's disease who lost her balance getting up to the bathroom and tripped and fell last night.  She fell onto her left side onto a table and has pain in her left posterior chest/shoulder.  She did not hit her head.  She presented to [facility] for evaluation.  She underwent trauma scans and was found to have enlarging and somewhat asymmetrical extra-axial CSF density collections with mass effect including slight leftward midline shift suggesting the possibility of an acute traumatic subdural hygroma, L 3-7 rib fx, L comminuted scapular fx, and incidental findings of a L inguinal and umbilical hernia.  She has been transferred to [facility] for trauma service admission and consultation with ortho and NSGY specialist.   Procedures none  Hospital Course:  Fall  Hygroma  NSGY, Dr. Debby following.  No acute intervention.  Follow up head CT in 3 months  L rib fxs 3-7 with tiny HPTX, increasing effusion  Pain control, IS, pulm toilet, PT/OT, repeat CXR the following morning with increase in pleural effusion.  Repeat CXR 7/26 looks stable.  Sating well on  RA with no shortness of breath.    L comminuted scapula fx  per ortho, no intervention, no sling, follow up with Ortho as outpatient.  HTN  Resume home med as well as hydrochlorothiazide  for LE edema, prn meds available as well  Parkinson's disease  Resume home med  Hypothyroidism  Resume home med  HLD  Sinus arrhythmia Noted on EKG.  No interventions  The patient was evaluated by therapies and SNF was recommended.  She was stable on HD 6 for DC.  Physical Exam: Gen: NAD, up getting a bath HEENT: PERRL Heart: regular, but with some ectopic beats Lungs: CTAB, some decrease BS at LLL Abd: soft, NT Ext: MAE Neuro: no deficits Psych: A&OX3  Allergies as of 11/17/2023   No Known Allergies      Medication List     STOP taking these medications    acetaminophen  650 MG CR tablet Commonly known as: TYLENOL  Replaced by: acetaminophen  500 MG tablet       TAKE these medications    acetaminophen  500 MG tablet Commonly known as: TYLENOL  Take 2 tablets (1,000 mg total) by mouth every 6 (six) hours. Replaces: acetaminophen  650 MG CR tablet   carbidopa -levodopa  25-100 MG tablet Commonly known as: SINEMET  IR Take 1 tablet by mouth 3 (three) times daily before meals.   hydrochlorothiazide  25 MG tablet Commonly known as: HYDRODIURIL  Take by mouth.   levothyroxine  100 MCG tablet Commonly known as: SYNTHROID  Take 100 mcg by mouth every morning.   losartan  100 MG tablet Commonly known as: COZAAR  Take 100 mg by mouth daily.  polyethylene glycol 17 g packet Commonly known as: MIRALAX  / GLYCOLAX  Take 17 g by mouth daily.   traMADol  50 MG tablet Commonly known as: ULTRAM  Take 1 tablet (50 mg total) by mouth every 6 (six) hours as needed for moderate pain (pain score 4-6).          Contact information for follow-up providers     Debby Dorn MATSU, MD Follow up in 3 week(s).   Specialty: Neurosurgery Why: Follow up for your head hygroma Contact  information: [address] [place] [address] [phone]         Kendal Franky SQUIBB, MD. Call in 3 week(s).   Specialty: Orthopedic Surgery Why: For scapula fracture Contact information: [address] [place] [address] [phone]         Atilano Deward ORN, MD Follow up.   Specialty: Family Medicine Why: As needed fo rib fractures Contact information: [address] [place] [address] [phone]              Contact information for after-discharge care     Destination     [facility] .   Service: Skilled Nursing Contact information: [address] [place]   [address] [phone]                     Signed: Burnard Banter, [facility] Surgery 11/17/2023, 8:42 AM Please see Amion for pager number during day hours 7:00am-4:30pm, 7-11:30am on Weekends

## 2023-11-17 NOTE — Plan of Care (Signed)
  Problem: Education: Goal: Knowledge of General Education information will improve Description: Including pain rating scale, medication(s)/side effects and non-pharmacologic comfort measures 11/17/2023 0129 by Marvis Kenneth SAILOR, RN Outcome: Progressing 11/16/2023 2237 by Marvis Kenneth SAILOR, RN Outcome: Progressing   Problem: Health Behavior/Discharge Planning: Goal: Ability to manage health-related needs will improve 11/17/2023 0129 by Marvis Kenneth SAILOR, RN Outcome: Progressing 11/16/2023 2237 by Marvis Kenneth SAILOR, RN Outcome: Progressing   Problem: Clinical Measurements: Goal: Ability to maintain clinical measurements within normal limits will improve 11/17/2023 0129 by Marvis Kenneth SAILOR, RN Outcome: Progressing 11/16/2023 2237 by Marvis Kenneth SAILOR, RN Outcome: Progressing Goal: Will remain free from infection 11/17/2023 0129 by Marvis Kenneth SAILOR, RN Outcome: Progressing 11/16/2023 2237 by Marvis Kenneth SAILOR, RN Outcome: Progressing Goal: Diagnostic test results will improve 11/17/2023 0129 by Marvis Kenneth SAILOR, RN Outcome: Progressing 11/16/2023 2237 by Marvis Kenneth SAILOR, RN Outcome: Progressing Goal: Respiratory complications will improve 11/17/2023 0129 by Marvis Kenneth SAILOR, RN Outcome: Progressing 11/16/2023 2237 by Marvis Kenneth SAILOR, RN Outcome: Progressing Goal: Cardiovascular complication will be avoided 11/17/2023 0129 by Marvis Kenneth SAILOR, RN Outcome: Progressing 11/16/2023 2237 by Marvis Kenneth SAILOR, RN Outcome: Progressing   Problem: Activity: Goal: Risk for activity intolerance will decrease 11/17/2023 0129 by Marvis Kenneth SAILOR, RN Outcome: Progressing 11/16/2023 2237 by Marvis Kenneth SAILOR, RN Outcome: Progressing   Problem: Nutrition: Goal: Adequate nutrition will be maintained 11/17/2023 0129 by Marvis Kenneth SAILOR, RN Outcome: Progressing 11/16/2023 2237 by Marvis Kenneth SAILOR, RN Outcome: Progressing   Problem: Coping: Goal: Level of anxiety will  decrease 11/17/2023 0129 by Marvis Kenneth SAILOR, RN Outcome: Progressing 11/16/2023 2237 by Marvis Kenneth SAILOR, RN Outcome: Progressing   Problem: Elimination: Goal: Will not experience complications related to bowel motility 11/17/2023 0129 by Marvis Kenneth SAILOR, RN Outcome: Progressing 11/16/2023 2237 by Marvis Kenneth SAILOR, RN Outcome: Progressing Goal: Will not experience complications related to urinary retention 11/17/2023 0129 by Marvis Kenneth SAILOR, RN Outcome: Progressing 11/16/2023 2237 by Marvis Kenneth SAILOR, RN Outcome: Progressing   Problem: Pain Managment: Goal: General experience of comfort will improve and/or be controlled 11/17/2023 0129 by Marvis Kenneth SAILOR, RN Outcome: Progressing 11/16/2023 2237 by Marvis Kenneth SAILOR, RN Outcome: Progressing   Problem: Safety: Goal: Ability to remain free from injury will improve 11/17/2023 0129 by Marvis Kenneth SAILOR, RN Outcome: Progressing 11/16/2023 2237 by Marvis Kenneth SAILOR, RN Outcome: Progressing   Problem: Skin Integrity: Goal: Risk for impaired skin integrity will decrease 11/17/2023 0129 by Marvis Kenneth SAILOR, RN Outcome: Progressing 11/16/2023 2237 by Marvis Kenneth SAILOR, RN Outcome: Progressing

## 2023-11-17 NOTE — TOC Transition Note (Signed)
 Transition of Care [facility]) - Discharge Note   Patient Details  Name: Amber Rosales MRN: [id number] Date of Birth: 1941/03/15  Transition of Care [facility]) CM/SW Contact:  Montie LOISE Louder, LCSW Phone Number: 11/17/2023, 10:10 AM   Clinical Narrative:     Patient will Discharge to: [facility] Discharge Date: 11/17/23 Family Notified: lef voice message w/daughter Transport Ab:[facility]  Per MD patient is ready for discharge. RN, patient, and facility notified of discharge. Discharge Summary sent to facility. RN given number for [phone]. Ambulance transport requested for patient.   Clinical Social Worker signing off.  Montie Louder, MSW, LCSW Clinical Social Worker    Final next level of care: Skilled Nursing Facility Barriers to Discharge: Barriers Resolved   Patient Goals and CMS Choice Patient states their goals for this hospitalization and ongoing recovery are:: SNF CMS Medicare.gov Compare Post Acute Care list provided to:: Patient Choice offered to / list presented to : Patient [place] ownership interest in [facility].provided to:: Patient    Discharge Placement              Patient chooses bed at: [facility] Patient to be transferred to facility by: [facility] Name of family member notified: LVM w/ daughter Patient and family notified of of transfer: 11/17/23  Discharge Plan and Services Additional resources added to the After Visit Summary for   In-house Referral: Clinical Social Work   Post Acute Care Choice: Skilled Nursing Facility                               Social Drivers of Health (SDOH) Interventions SDOH Screenings   Food Insecurity: No Food Insecurity (11/11/2023)  Housing: Low Risk  (11/11/2023)  Transportation Needs: No Transportation Needs (11/11/2023)  Utilities: Not At Risk (11/11/2023)  Alcohol  Screen: Low Risk  (07/31/2021)  Depression (PHQ2-9): Low Risk  (07/31/2021)  Financial Resource Strain: Low Risk   (07/31/2021)  Physical Activity: Inactive (07/31/2021)  Social Connections: Moderately Integrated (11/11/2023)  Stress: No Stress Concern Present (07/31/2021)  Tobacco Use: Medium Risk (11/11/2023)     Readmission Risk Interventions     No data to display

## 2023-11-18 ENCOUNTER — Encounter: Payer: Self-pay | Admitting: Adult Health

## 2023-11-18 ENCOUNTER — Non-Acute Institutional Stay (SKILLED_NURSING_FACILITY): Payer: Self-pay | Admitting: Adult Health

## 2023-11-18 DIAGNOSIS — G20A1 Parkinson's disease without dyskinesia, without mention of fluctuations: Secondary | ICD-10-CM | POA: Diagnosis not present

## 2023-11-18 DIAGNOSIS — I10 Essential (primary) hypertension: Secondary | ICD-10-CM

## 2023-11-18 DIAGNOSIS — D181 Lymphangioma, any site: Secondary | ICD-10-CM | POA: Diagnosis not present

## 2023-11-18 DIAGNOSIS — E039 Hypothyroidism, unspecified: Secondary | ICD-10-CM | POA: Diagnosis not present

## 2023-11-18 DIAGNOSIS — S2242XG Multiple fractures of ribs, left side, subsequent encounter for fracture with delayed healing: Secondary | ICD-10-CM | POA: Diagnosis not present

## 2023-11-18 DIAGNOSIS — K5909 Other constipation: Secondary | ICD-10-CM

## 2023-11-18 NOTE — Progress Notes (Signed)
 Location:  [facility] Room Number: 132-P Place of Service:  SNF (31)   CODE STATUS: full   No Known Allergies  Chief Complaint  Patient presents with   Hospitalization Follow-up    HPI:  She is a 83 year old woman who has been hospitalized from 11-11-23 through 11-17-23. Her past medical history includes: hypertension; hypothyroidism; anxiety parkinson's disease. She lost her balance getting up to the bathroom and fell. She fell onto her left side to a table and has pain in her left posterior chest/shoulder area. She did not hit her head. 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 Without no intervention required.  Hygroma followed by Dr. Debby without acute intervention; will need follow up head ct in 3 months. She is here for short term rehab with her goal to return back home. 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  Past Medical History:  Diagnosis Date   Anxiety    Hyperlipidemia    Hypertension    Hypothyroidism    Parkinson's disease (HCC)    Tremor    Vertigo     Past Surgical History:  Procedure Laterality Date   DILATION AND CURETTAGE OF UTERUS     WISDOM TOOTH EXTRACTION      Social History   Socioeconomic History   Marital status: Married    Spouse name: Not on file   Number of children: Not on file   Years of education: Not on file   Highest education level: Not on file  Occupational History   Not on file  Tobacco Use   Smoking status: Former    Current packs/day: 0.00    Types: Cigarettes    Start date: 12/17/1947    Quit date: 12/16/1957    Years since quitting: 65.9   Smokeless tobacco: Never  Vaping Use   Vaping status: Never Used  Substance and Sexual Activity   Alcohol  use: No   Drug use: No    Sexual activity: Not Currently    Birth control/protection: Post-menopausal  Other Topics Concern   Not on file  Social History Narrative   Lives with husband   Social Drivers of Health   Financial Resource Strain: Low Risk  (07/31/2021)   Overall Financial Resource Strain (CARDIA)    Difficulty of Paying Living Expenses: Not hard at all  Food Insecurity: No Food Insecurity (11/11/2023)   Hunger Vital Sign    Worried About Running Out of Food in the Last Year: Never true    Ran Out of Food in the Last Year: Never true  Transportation Needs: No Transportation Needs (11/11/2023)   PRAPARE - Administrator, Civil Service (Medical): No    Lack of Transportation (Non-Medical): No  Physical Activity: Inactive (07/31/2021)   Exercise Vital Sign    Days of Exercise per Week: 0 days    Minutes of Exercise per Session: 0 min  Stress: No Stress Concern Present (07/31/2021)   [name]    Feeling of Stress : Only a little  Social Connections: Moderately Integrated (11/11/2023)   Social Connection and Isolation Panel    Frequency of Communication with Friends and Family: More than three times a week    Frequency of Social Gatherings with Friends and Family: Twice a week  Attends Religious Services: More than 4 times per year    Active Member of Clubs or Organizations: Yes    Attends Banker Meetings: More than 4 times per year    Marital Status: Widowed  Intimate Partner Violence: Unknown (11/11/2023)   Humiliation, Afraid, Rape, and Kick questionnaire    Fear of Current or Ex-Partner: No    Emotionally Abused: No    Physically Abused: Not on file    Sexually Abused: No   Family History  Problem Relation Age of Onset   Hypertension Mother    Cancer Mother    CAD Father    Heart failure Sister    Heart failure Brother       VITAL SIGNS BP 138/76   Pulse 72   Temp (!) 97.4 F (36.3 C)   Ht  4' 11 (1.499 m)   Wt 126 lb 12.8 oz (57.5 kg)   SpO2 95%   BMI 25.61 kg/m   Outpatient Encounter Medications as of 11/18/2023  Medication Sig Note   acetaminophen  (TYLENOL ) 500 MG tablet Take 2 tablets (1,000 mg total) by mouth every 6 (six) hours. (Patient taking differently: Take 1,000 mg by mouth every 8 (eight) hours as needed.)    carbidopa -levodopa  (SINEMET  IR) 25-100 MG tablet Take 1 tablet by mouth 3 (three) times daily before meals.    hydrochlorothiazide  (HYDRODIURIL ) 25 MG tablet Take by mouth. 11/11/2023: Pt has bottle of medication with her in a bag   levothyroxine  (SYNTHROID ) 100 MCG tablet Take 100 mcg by mouth every morning.    losartan  (COZAAR ) 100 MG tablet Take 100 mg by mouth daily.    polyethylene glycol (MIRALAX  / GLYCOLAX ) 17 g packet Take 17 g by mouth daily.    traMADol  (ULTRAM ) 50 MG tablet Take 1 tablet (50 mg total) by mouth every 6 (six) hours as needed for moderate pain (pain score 4-6).    No facility-administered encounter medications on file as of 11/18/2023.     SIGNIFICANT DIAGNOSTIC EXAMS  LABS REVIEWED:   11-11-23: wbc 12.3; hgb 14.2; hct 43.0; mcv 92.3 plt 259; glucose 111; bun 16 creat 0.92; k+ 3.5; na++ 137; ca 8.9; gfr >60; protein 6.5 albumin 3.7  Review of Systems  Constitutional:  Negative for malaise/fatigue.  Respiratory:  Negative for cough and shortness of breath.   Cardiovascular:  Negative for chest pain, palpitations and leg swelling.  Gastrointestinal:  Negative for abdominal pain, constipation and heartburn.  Musculoskeletal:  Negative for back pain, joint pain and myalgias.  Skin: Negative.   Neurological:  Negative for dizziness.  Psychiatric/Behavioral:  The patient is not nervous/anxious.    Physical Exam Vitals reviewed.  Constitutional:      General: She is not in acute distress.    Appearance: She is well-developed. She is not diaphoretic.  Neck:     Thyroid : No thyromegaly.  Cardiovascular:     Rate and Rhythm:  Normal rate and regular rhythm.     Heart sounds: Normal heart sounds.  Pulmonary:     Effort: Pulmonary effort is normal. No respiratory distress.     Breath sounds: Normal breath sounds.  Abdominal:     General: Bowel sounds are normal. There is no distension.     Palpations: Abdomen is soft.     Tenderness: There is no abdominal tenderness.  Musculoskeletal:        General: Normal range of motion.     Cervical back: Neck supple.     Right lower leg:  No edema.     Left lower leg: No edema.  Lymphadenopathy:     Cervical: No cervical adenopathy.  Skin:    General: Skin is warm and dry.  Neurological:     Mental Status: She is alert. Mental status is at baseline.  Psychiatric:        Mood and Affect: Mood normal.       ASSESSMENT/ PLAN:  TODAY  Closed fracture of multiple ribs of left side with delayed healing subsequent encounter: has ultram  50 mg every 6 hours as needed. Will continue therapy as directed to improve upon her level of independence with her adl's.   2. Acquired hypothyroidism: will continue synthroid  100 mcg daily   3. Primary hypertension: b/p 138/76 will continue hydrochlorothiazide  25 mg daily cozaar  100 mg daily   4. 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 Chronic constipation: will continue miralax  daily   6. Hygroma: is followed by Dr. Debby will need ct scan in 3 months.    Barnie Seip NP [facility]  call [phone]

## 2023-11-19 ENCOUNTER — Encounter: Payer: Self-pay | Admitting: Internal Medicine

## 2023-11-19 ENCOUNTER — Non-Acute Institutional Stay (SKILLED_NURSING_FACILITY): Payer: Self-pay | Admitting: Internal Medicine

## 2023-11-19 DIAGNOSIS — S065X0D Traumatic subdural hemorrhage without loss of consciousness, subsequent encounter: Secondary | ICD-10-CM

## 2023-11-19 DIAGNOSIS — F321 Major depressive disorder, single episode, moderate: Secondary | ICD-10-CM

## 2023-11-19 DIAGNOSIS — S065XAA Traumatic subdural hemorrhage with loss of consciousness status unknown, initial encounter: Secondary | ICD-10-CM | POA: Insufficient documentation

## 2023-11-19 DIAGNOSIS — G20A1 Parkinson's disease without dyskinesia, without mention of fluctuations: Secondary | ICD-10-CM

## 2023-11-19 DIAGNOSIS — I10 Essential (primary) hypertension: Secondary | ICD-10-CM

## 2023-11-19 DIAGNOSIS — S2242XG Multiple fractures of ribs, left side, subsequent encounter for fracture with delayed healing: Secondary | ICD-10-CM | POA: Diagnosis not present

## 2023-11-19 DIAGNOSIS — S2249XA Multiple fractures of ribs, unspecified side, initial encounter for closed fracture: Secondary | ICD-10-CM | POA: Insufficient documentation

## 2023-11-19 NOTE — Progress Notes (Signed)
 NURSING HOME LOCATION:  [facility] ROOM NUMBER:  [id number] P  CODE STATUS:  Full Code  PCP:  Deward Soja MD  This is a comprehensive admission note to this SNFperformed on this date less than 30 days from date of admission. Included are preadmission medical/surgical history; reconciled medication list; family history; social history and comprehensive review of systems.  Corrections and additions to the records were documented. Comprehensive physical exam was also performed. Additionally a clinical summary was entered for each active diagnosis pertinent to this admission in the Problem List to enhance continuity of care.  HPI: She was hospitalized 7/24 - 11/17/2023 after mechanical fall when she lost her balance going to the bathroom.  She struck her left side on a table.  Imaging revealed an enlarging and somewhat asymmetrical extra-axial CSF density collections with mass effect including slight leftward midline shift suggesting the possibility of an acute traumatic subdural hematoma.  Additionally left 3rd through 7th rib fractures, left comminuted scapular fracture, and incidental left inguinal and umbilical hernias were documented.  She was transferred from [facility] to [facility] for trauma service admission for Ortho and NSGY specialty consultations.  Dr. Debby, NS, did not recommend any acute Intervention.  Follow-up CT scan in 3 months was recommended.  The left rib fractures were associated with a small hemopneumothorax and an effusion which initially increased.  Pulmonary toilet, incentive spirometry and pain control were pursued.  Serial films revealed the effusion was stable and was not associated with any desaturation or dyspnea clinically. Orthopedics did not recommend any surgery for the comminuted scapular fracture. She exhibited minimal hyperglycemia with glucoses ranging from 100 to 111.  Initial white blood count was 12,300 but this normalized to  7500.  Despite the extensive trauma; no anemia was documented.  Past medical and surgical history: Includes essential hypertension, Parkinson's, hypothyroidism, anxiety disorder, history of vertigo, and dyslipidemia. Significant surgeries and procedures include D&C.  Family history: reviewed, non contributory due to advanced age.  Social history: Nondrinker; former smoker.   Review of systems: She is cognizant of diagnoses made except for the CNS and hemopneumothorax issues.  She states 3 broke ribs, broke shoulder & therapy recommended.  She states that she continues to hurt real bad in the left side and shoulder. Despite PMH of vertigo, she validates that there was no neurologic or cardiac prodrome prior to the fall but she simply lost her balance in the context of her Parkinson's. She states that this is a common phenomena which resulted in her sidestepping and falling.  She does describe occasional palpitations for which she sees a Development worker, international aid.  She describes dysphagia if she eats big bites.  She takes MiraLAX  as needed for constipation.  She validates anxiety and depression in the context of the serious complications and the death of her husband in Jun 23, 2023.  She made the comment I cannot take much more.  Constitutional: No fever, significant weight change  Eyes: No redness, discharge, pain, vision change ENT/mouth: No nasal congestion, purulent discharge, earache, change in hearing, sore throat  Cardiovascular: No paroxysmal nocturnal dyspnea, claudication, edema  Respiratory: No cough, sputum production, hemoptysis, DOE, significant snoring, apnea  Gastrointestinal: No heartburn, abdominal pain, nausea /vomiting, rectal bleeding, melena Genitourinary: No dysuria, hematuria, pyuria, incontinence, nocturia Dermatologic: No rash, pruritus, change in appearance of skin Neurologic: No dizziness, headache, syncope, seizures, numbness, tingling Endocrine: No change in hair/skin/nails,  excessive thirst, excessive hunger, excessive urination  Hematologic/lymphatic: No significant bruising, lymphadenopathy,  abnormal bleeding Allergy/immunology: No itchy/watery eyes, significant sneezing, urticaria, angioedema  Physical exam:  Pertinent or positive findings: She exhibits masked facies.  Affect is flat and responses are slow.  She has bilateral ptosis, greater on the left than the right.  Breath sounds are decreased on the left.  There is marked decrease in the pedal pulses.  She has interosseous wasting of the hands.  There are marked flexion contractures of the 3rd and 4th left fingers.  There is medial deviation of the DIP joint of the right index finger.  She exhibits intermittent tremor of the right hand which is not pill-rolling in nature.  General appearance: no acute distress, increased work of breathing is present.   Lymphatic: No lymphadenopathy about the head, neck, axilla. Eyes: No conjunctival inflammation or lid edema is present. There is no scleral icterus. Ears:  External ear exam shows no significant lesions or deformities.   Nose:  External nasal examination shows no deformity or inflammation. Nasal mucosa are pink and moist without lesions, exudates Oral exam: Lips and gums are healthy appearing.There is no oropharyngeal erythema or exudate. Neck:  No thyromegaly, masses, tenderness noted.    Heart:  Normal rate and regular rhythm. S1 and S2 normal without gallop, murmur, click, rub.  Lungs: without wheezes, rhonchi, rales, rubs. Abdomen: Bowel sounds are normal.  Abdomen is soft and nontender with no organomegaly, hernias, masses. GU: Deferred  Extremities:  No cyanosis, clubbing, edema. Neurologic exam: Balance, Rhomberg, finger to nose testing could not be completed due to clinical state Skin: Warm & dry w/o tenting. No significant lesions or rash.  See clinical summary under each active problem in the Problem List with associated updated therapeutic plan

## 2023-11-19 NOTE — Assessment & Plan Note (Addendum)
 Continue to optimize pain regimen.  PT/OT as tolerated. Decreased breath sounds on L w/o dyspnea or hypoxia. Repeat CXR if either occur.

## 2023-11-19 NOTE — Patient Instructions (Signed)
 See assessment and plan under each diagnosis in the problem list and acutely for this visit

## 2023-11-19 NOTE — Assessment & Plan Note (Addendum)
 Systolic blood pressure is mildly elevated but the patient has suboptimally controlled pain related to the rib and scapular fractures.  Acute optimization of pain control with adjustment of antihypertensives based on average blood pressure once such is achieved.

## 2023-11-19 NOTE — Assessment & Plan Note (Signed)
 Parkinson's associated with imbalance which resulted in a mechanical fall with multiple musculoskeletal trauma complications.  Neurology follow-up postdischarge from the SNF.

## 2023-11-20 DIAGNOSIS — F329 Major depressive disorder, single episode, unspecified: Secondary | ICD-10-CM | POA: Insufficient documentation

## 2023-11-20 NOTE — Assessment & Plan Note (Signed)
 She was unaware of SDH & is asymptomatic in relation to this.NS follow up if new non Parkinson's signs present.

## 2023-11-20 NOTE — Assessment & Plan Note (Signed)
 Grief counseling may be of benefit. Trial of SSRI problematic due to potential Serotonin Syndrome risk as she is on Tramadol  for active pain.

## 2023-11-24 ENCOUNTER — Encounter: Payer: Self-pay | Admitting: Internal Medicine

## 2023-11-24 DIAGNOSIS — K5909 Other constipation: Secondary | ICD-10-CM | POA: Insufficient documentation

## 2023-11-24 DIAGNOSIS — R29818 Other symptoms and signs involving the nervous system: Secondary | ICD-10-CM | POA: Insufficient documentation

## 2023-11-24 DIAGNOSIS — D181 Lymphangioma, any site: Secondary | ICD-10-CM | POA: Insufficient documentation

## 2023-11-24 DIAGNOSIS — E039 Hypothyroidism, unspecified: Secondary | ICD-10-CM | POA: Insufficient documentation

## 2023-11-30 ENCOUNTER — Encounter: Payer: Self-pay | Admitting: Adult Health

## 2023-11-30 NOTE — Telephone Encounter (Signed)
 Landy Barnie RAMAN, NP to Me (Selected Message)     11/30/23 12:15 PM I will ask the social worker to provide letter that I will sign Have a good day  Deb

## 2023-11-30 NOTE — Telephone Encounter (Signed)
 Message routed to NP who patient has seen today.

## 2023-11-30 NOTE — Telephone Encounter (Signed)
 Thanks for letting me know Amber Rosales.

## 2023-12-03 ENCOUNTER — Encounter: Payer: Self-pay | Admitting: Adult Health

## 2023-12-03 ENCOUNTER — Other Ambulatory Visit: Payer: Self-pay | Admitting: Adult Health

## 2023-12-03 ENCOUNTER — Non-Acute Institutional Stay (SKILLED_NURSING_FACILITY): Payer: Self-pay | Admitting: Adult Health

## 2023-12-03 DIAGNOSIS — G20A1 Parkinson's disease without dyskinesia, without mention of fluctuations: Secondary | ICD-10-CM | POA: Diagnosis not present

## 2023-12-03 DIAGNOSIS — R29818 Other symptoms and signs involving the nervous system: Secondary | ICD-10-CM | POA: Diagnosis not present

## 2023-12-03 DIAGNOSIS — R4189 Other symptoms and signs involving cognitive functions and awareness: Secondary | ICD-10-CM | POA: Diagnosis not present

## 2023-12-03 DIAGNOSIS — I10 Essential (primary) hypertension: Secondary | ICD-10-CM

## 2023-12-03 MED ORDER — TRAMADOL HCL 50 MG PO TABS: 50.0000 mg | ORAL_TABLET | Freq: Every day | ORAL | 0 refills | Status: DC | PRN

## 2023-12-03 NOTE — Progress Notes (Signed)
 Location:  [facility] Room Number: [id number] Place of Service:  SNF (31)   CODE STATUS: full   No Known Allergies  Chief Complaint  Patient presents with   Acute Visit    Care plan meeting.     HPI:  We have come together for her care plan meeting. Family present. SLUMS 24/30 BIMS 15/15 mood 3/30: restless some depression. Uses wheelchair without falls. She requires moderate to dependent assist with her adl care. She is occasionally incontinent of bladder and bowel. Dietary: regular diet with weighted utensils; appetite 1-75%; setup for meals weight is 125.8 pounds. Therapy: ambulate 60 feet rolling walker with mod assist; transfers mod assist; upper body min assist lower body max assist; brp max assist. Activities: not active at this time. 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   Essential hypertension   Neurocognitive deficits   Past Medical History:  Diagnosis Date   Anxiety    Hyperlipidemia    Hypertension    Hypothyroidism    Parkinson's disease (HCC)    Tremor    Vertigo     Past Surgical History:  Procedure Laterality Date   DILATION AND CURETTAGE OF UTERUS     WISDOM TOOTH EXTRACTION      Social History   Socioeconomic History   Marital status: Married    Spouse name: Not on file   Number of children: Not on file   Years of education: Not on file   Highest education level: Not on file  Occupational History   Not on file  Tobacco Use   Smoking status: Former    Current packs/day: 0.00    Types: Cigarettes    Start date: 12/17/1947    Quit date: 12/16/1957    Years since quitting: 66.0   Smokeless tobacco: Never  Vaping Use   Vaping status: Never Used  Substance and Sexual Activity   Alcohol  use: No   Drug use: No   Sexual activity: Not Currently    Birth control/protection: Post-menopausal  Other Topics Concern   Not on file  Social History  Narrative   Lives with husband   Social Drivers of Health   Financial Resource Strain: Low Risk  (07/31/2021)   Overall Financial Resource Strain (CARDIA)    Difficulty of Paying Living Expenses: Not hard at all  Food Insecurity: No Food Insecurity (11/11/2023)   Hunger Vital Sign    Worried About Running Out of Food in the Last Year: Never true    Ran Out of Food in the Last Year: Never true  Transportation Needs: No Transportation Needs (11/11/2023)   PRAPARE - Administrator, Civil Service (Medical): No    Lack of Transportation (Non-Medical): No  Physical Activity: Inactive (07/31/2021)   Exercise Vital Sign    Days of Exercise per Week: 0 days    Minutes of Exercise per Session: 0 min  Stress: No Stress Concern Present (07/31/2021)   [name]    Feeling of Stress : Only a little  Social Connections: Moderately Integrated (11/11/2023)   Social Connection and Isolation Panel    Frequency of Communication with Friends and Family: More than three times a week    Frequency of Social Gatherings with Friends and Family: Twice a week    Attends Religious Services: More than 4 times per year    Active Member of Clubs or  Organizations: Yes    Attends Engineer, structural: More than 4 times per year    Marital Status: Widowed  Intimate Partner Violence: Unknown (11/11/2023)   Humiliation, Afraid, Rape, and Kick questionnaire    Fear of Current or Ex-Partner: No    Emotionally Abused: No    Physically Abused: Not on file    Sexually Abused: No   Family History  Problem Relation Age of Onset   Hypertension Mother    Cancer Mother    CAD Father    Heart failure Sister    Heart failure Brother       VITAL SIGNS BP 123/61   Pulse 70   Temp 97.9 F (36.6 C)   Resp 18   Ht 5' 2 (1.575 m)   Wt 125 lb 11.2 oz (57 kg)   SpO2 98%   BMI 22.99 kg/m   Outpatient Encounter Medications as of  12/03/2023  Medication Sig Note   acetaminophen  (TYLENOL ) 650 MG CR tablet Take 650 mg by mouth every 6 (six) hours.    [DISCONTINUED] traMADol  (ULTRAM ) 50 MG tablet Take 50 mg by mouth daily as needed (give prior to therapy).    carbidopa -levodopa  (SINEMET  IR) 25-100 MG tablet Take 1 tablet by mouth 3 (three) times daily before meals.    hydrochlorothiazide  (HYDRODIURIL ) 25 MG tablet Take by mouth. 11/11/2023: Pt has bottle of medication with her in a bag   levothyroxine  (SYNTHROID ) 100 MCG tablet Take 100 mcg by mouth every morning.    losartan  (COZAAR ) 100 MG tablet Take 100 mg by mouth daily.    polyethylene glycol (MIRALAX  / GLYCOLAX ) 17 g packet Take 17 g by mouth daily.    [DISCONTINUED] acetaminophen  (TYLENOL ) 500 MG tablet Take 2 tablets (1,000 mg total) by mouth every 6 (six) hours. (Patient taking differently: Take 1,000 mg by mouth every 8 (eight) hours as needed.)    [DISCONTINUED] traMADol  (ULTRAM ) 50 MG tablet Take 1 tablet (50 mg total) by mouth every 6 (six) hours as needed for moderate pain (pain score 4-6).    No facility-administered encounter medications on file as of 12/03/2023.     SIGNIFICANT DIAGNOSTIC EXAMS  LABS REVIEWED:   11-11-23: wbc 12.3; hgb 14.2; hct 43.0; mcv 92.3 plt 259; glucose 111; bun 16 creat 0.92; k+ 3.5; na++ 137; ca 8.9; gfr >60; protein 6.5 albumin 3.7  Review of Systems  Constitutional:  Negative for malaise/fatigue.  Respiratory:  Negative for cough and shortness of breath.   Cardiovascular:  Negative for chest pain, palpitations and leg swelling.  Gastrointestinal:  Negative for abdominal pain, constipation and heartburn.  Musculoskeletal:  Negative for back pain, joint pain and myalgias.  Skin: Negative.   Neurological:  Negative for dizziness.  Psychiatric/Behavioral:  The patient is not nervous/anxious.     Physical Exam Constitutional:      General: She is not in acute distress.    Appearance: She is well-developed. She is not  diaphoretic.  Neck:     Thyroid : No thyromegaly.  Cardiovascular:     Rate and Rhythm: Normal rate and regular rhythm.     Heart sounds: Normal heart sounds.  Pulmonary:     Effort: Pulmonary effort is normal. No respiratory distress.     Breath sounds: Normal breath sounds.  Abdominal:     General: Bowel sounds are normal. There is no distension.     Palpations: Abdomen is soft.     Tenderness: There is no abdominal tenderness.  Musculoskeletal:  General: Normal range of motion.     Cervical back: Neck supple.     Right lower leg: No edema.     Left lower leg: No edema.  Lymphadenopathy:     Cervical: No cervical adenopathy.  Skin:    General: Skin is warm and dry.  Neurological:     Mental Status: She is alert. Mental status is at baseline.  Psychiatric:        Mood and Affect: Mood normal.     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

## 2023-12-07 DIAGNOSIS — S42102A Fracture of unspecified part of scapula, left shoulder, initial encounter for closed fracture: Secondary | ICD-10-CM | POA: Diagnosis not present

## 2023-12-15 ENCOUNTER — Encounter: Payer: Self-pay | Admitting: Adult Health

## 2023-12-15 ENCOUNTER — Non-Acute Institutional Stay (SKILLED_NURSING_FACILITY): Payer: Self-pay | Admitting: Adult Health

## 2023-12-15 DIAGNOSIS — E039 Hypothyroidism, unspecified: Secondary | ICD-10-CM

## 2023-12-15 DIAGNOSIS — I10 Essential (primary) hypertension: Secondary | ICD-10-CM

## 2023-12-15 DIAGNOSIS — G20A1 Parkinson's disease without dyskinesia, without mention of fluctuations: Secondary | ICD-10-CM

## 2023-12-15 NOTE — Progress Notes (Unsigned)
 Location:  [facility] Room Number: 132-P Place of Service:  SNF (31) Provider: Landy Barnie RAMAN, NP  Patient Care Team: Atilano Deward ORN, MD as PCP - General (Family Medicine) Alvan Dorn FALCON, MD as PCP - Cardiology (Cardiology)  Extended Emergency Contact Information Primary Emergency Contact: [facility] Mobile Phone: [phone] Relation: Daughter Secondary Emergency Contact: McGriff,Teresa Mobile Phone: [phone] Relation: Daughter  Code Status:  Full Code Goals of care: Advanced Directive information    02/12/2023    1:51 PM  Advanced Directives  Does Patient Have a Medical Advance Directive? Yes     Chief Complaint  Patient presents with   Medical Management of Chronic Issues    HPI:  Pt is a 83 y.o. female seen today for medical management of chronic diseases.     Past Medical History:  Diagnosis Date   Anxiety    Hyperlipidemia    Hypertension    Hypothyroidism    Parkinson's disease (HCC)    Tremor    Vertigo    Past Surgical History:  Procedure Laterality Date   DILATION AND CURETTAGE OF UTERUS     WISDOM TOOTH EXTRACTION      No Known Allergies  Outpatient Encounter Medications as of 12/15/2023  Medication Sig   acetaminophen  (TYLENOL ) 650 MG CR tablet Take 650 mg by mouth every 6 (six) hours.   carbidopa -levodopa  (SINEMET  IR) 25-100 MG tablet Take 1 tablet by mouth 3 (three) times daily before meals.   DULoxetine (CYMBALTA) 20 MG capsule Take 20 mg by mouth daily.   hydrochlorothiazide  (HYDRODIURIL ) 25 MG tablet Take by mouth.   levothyroxine  (SYNTHROID ) 100 MCG tablet Take 100 mcg by mouth every morning.   losartan  (COZAAR ) 100 MG tablet Take 100 mg by mouth daily.   polyethylene glycol (MIRALAX  / GLYCOLAX ) 17 g packet Take 17 g by mouth daily.   ondansetron  (ZOFRAN -ODT) 4 MG disintegrating tablet Take 4 mg by mouth every 8 (eight) hours as needed for nausea or vomiting.   traMADol  (ULTRAM ) 50 MG tablet Take 1  tablet (50 mg total) by mouth daily as needed (give prior to therapy).   No facility-administered encounter medications on file as of 12/15/2023.    Review of Systems  Immunization History  Administered Date(s) Administered    sv, Bivalent, Protein Subunit Rsvpref,pf [place]low) 09/08/2022   Influenza-Unspecified 02/15/2023   Moderna Sars-Covid-2 Vaccination 05/02/2019, 06/12/2019   PNEUMOCOCCAL CONJUGATE-20 06/29/2023   Tdap 03/10/2023   Unspecified SARS-COV-2 Vaccination 02/25/2021   Zoster Recombinant(Shingrix) 11/03/2018, 09/08/2022, 03/10/2023   Pertinent  Health Maintenance Due  Topic Date Due   DEXA SCAN  Never done   INFLUENZA VACCINE  11/19/2023      03/08/2018    6:16 PM 07/31/2021   10:13 AM 03/15/2023    2:07 PM 11/08/2023    3:08 PM  Fall Risk  Falls in the past year? 0  0 1 0  Was there an injury with Fall?  0 0 0  Fall Risk Category Calculator  0 1 0  Fall Risk Category (Retired)  Low     (RETIRED) Patient Fall Risk Level  Low fall risk        Data saved with a previous flowsheet row definition   Functional Status Survey:    Vitals:   12/15/23 1020  BP: 129/76  Pulse: 68  Temp: 97.7 F (36.5 C)  SpO2: 95%  Weight: 122 lb 9.6 oz (55.6 kg)  Height: 5' 2 (1.575 m)   Body mass index  is 22.42 kg/m. Physical Exam  Labs reviewed: Recent Labs    11/11/23 0558 11/12/23 0616  NA 137 136  K 3.5 3.7  CL 107 102  CO2 22 25  GLUCOSE 111* 100*  BUN 16 11  CREATININE 0.92 0.82  CALCIUM  8.9 9.0   Recent Labs    11/11/23 0558  AST 19  ALT 8  ALKPHOS 109  BILITOT 0.7  PROT 6.5  ALBUMIN 3.7   Recent Labs    11/11/23 0558 11/12/23 0616  WBC 12.3* 7.5  HGB 14.2 13.1  HCT 43.0 38.7  MCV 92.3 90.6  PLT 259 234   Lab Results  Component Value Date   TSH 3.130 12/16/2013   No results found for: HGBA1C No results found for: CHOL, HDL, LDLCALC, LDLDIRECT, TRIG, CHOLHDL  Significant Diagnostic Results in last 30 days:  No  results found.  Assessment/Plan There are no diagnoses linked to this encounter.   Family/ staff Communication: ***  Labs/tests ordered:  ***

## 2023-12-16 DIAGNOSIS — M6281 Muscle weakness (generalized): Secondary | ICD-10-CM | POA: Diagnosis not present

## 2023-12-17 ENCOUNTER — Other Ambulatory Visit (HOSPITAL_COMMUNITY)
Admission: RE | Admit: 2023-12-17 | Discharge: 2023-12-17 | Disposition: A | Discharge status: Home / Self Care | Source: Skilled Nursing Facility | Attending: Adult Health | Admitting: Adult Health

## 2023-12-17 DIAGNOSIS — E039 Hypothyroidism, unspecified: Secondary | ICD-10-CM | POA: Diagnosis not present

## 2023-12-17 DIAGNOSIS — M6281 Muscle weakness (generalized): Secondary | ICD-10-CM | POA: Diagnosis not present

## 2023-12-17 LAB — TSH: TSH: 3.995 u[IU]/mL (ref 0.350–4.500)

## 2023-12-20 DIAGNOSIS — M6281 Muscle weakness (generalized): Secondary | ICD-10-CM | POA: Diagnosis not present

## 2023-12-21 DIAGNOSIS — M6281 Muscle weakness (generalized): Secondary | ICD-10-CM | POA: Diagnosis not present

## 2023-12-22 DIAGNOSIS — M6281 Muscle weakness (generalized): Secondary | ICD-10-CM | POA: Diagnosis not present

## 2023-12-22 DIAGNOSIS — S42102D Fracture of unspecified part of scapula, left shoulder, subsequent encounter for fracture with routine healing: Secondary | ICD-10-CM | POA: Diagnosis not present

## 2023-12-23 DIAGNOSIS — M6281 Muscle weakness (generalized): Secondary | ICD-10-CM | POA: Diagnosis not present

## 2023-12-24 DIAGNOSIS — S42102D Fracture of unspecified part of scapula, left shoulder, subsequent encounter for fracture with routine healing: Secondary | ICD-10-CM | POA: Diagnosis not present

## 2023-12-24 DIAGNOSIS — M6281 Muscle weakness (generalized): Secondary | ICD-10-CM | POA: Diagnosis not present

## 2024-01-11 ENCOUNTER — Ambulatory Visit: Payer: Medicare Other | Admitting: Diagnostic Neuroimaging

## 2024-01-11 ENCOUNTER — Encounter: Payer: Self-pay | Admitting: Diagnostic Neuroimaging

## 2024-01-11 VITALS — BP 100/53 | HR 93 | Ht 59.0 in

## 2024-01-11 DIAGNOSIS — R5381 Other malaise: Secondary | ICD-10-CM | POA: Diagnosis not present

## 2024-01-11 DIAGNOSIS — R29898 Other symptoms and signs involving the musculoskeletal system: Secondary | ICD-10-CM | POA: Diagnosis not present

## 2024-01-11 DIAGNOSIS — G20A1 Parkinson's disease without dyskinesia, without mention of fluctuations: Secondary | ICD-10-CM | POA: Diagnosis not present

## 2024-01-11 NOTE — Progress Notes (Signed)
 [facility]  PATIENT: Amber Rosales DOB: Aug 23, 1940  REFERRING CLINICIAN: Sasser, Deward ORN, MD HISTORY FROM: patient and daughter REASON FOR VISIT: follow up   HISTORICAL  CHIEF COMPLAINT:  Chief Complaint  Patient presents with   Tremors    Rm 6 with daughter Rhoda  Pt is well, reports her balance has worsen since last visit. She had a recent fall in July and broke her ribs and shoulder blade.     HISTORY OF PRESENT ILLNESS:   UPDATE (01/11/24, VRP): Since last visit, had another fall in July 2025 with rib and scapula fracture. Now in SNF. Symptoms are progressive. Tolerating carb/levo.   UPDATE (01/18/23, VRP): Since last visit, now living with daughters. Her husband now in SNF / memory center. Tolerating carb/levo, and helping tremor and energy.    PRIOR HPI (01/13/22): 83 year old female here for evaluation of tremor.  Symptoms started 2 to 3 years ago with resting, postural and action tremor in the right greater than left upper extremities.  Also with decline in gait and mobility.  Having short shuffling steps, tripping and falling.  No change in speech or swallowing.  Having more issues with anxiety and constipation.  She lives at home with her husband.  Husband has dementia.  2 daughters are here for this visit, 1 lives 20 minutes away and the other 2 hours week.  1 daughter helps with finances and the other daughter helps with food and other tasks.   REVIEW OF SYSTEMS: Full 14 system review of systems performed and negative with exception of: as per HPI.  ALLERGIES: No Known Allergies  HOME MEDICATIONS: Outpatient Medications Prior to Visit  Medication Sig Dispense Refill   acetaminophen  (TYLENOL ) 650 MG CR tablet Take 650 mg by mouth every 6 (six) hours.     carbidopa -levodopa  (SINEMET  IR) 25-100 MG tablet Take 1 tablet by mouth 3 (three) times daily before meals. 270 tablet 4   DULoxetine (CYMBALTA) 20 MG capsule Take 20 mg by mouth daily.      hydrochlorothiazide  (HYDRODIURIL ) 25 MG tablet Take by mouth.     levothyroxine  (SYNTHROID ) 100 MCG tablet Take 100 mcg by mouth every morning.     losartan  (COZAAR ) 100 MG tablet Take 100 mg by mouth daily.     ondansetron  (ZOFRAN -ODT) 4 MG disintegrating tablet Take 4 mg by mouth every 8 (eight) hours as needed for nausea or vomiting.     polyethylene glycol (MIRALAX  / GLYCOLAX ) 17 g packet Take 17 g by mouth daily.     traMADol  (ULTRAM ) 50 MG tablet Take by mouth every 6 (six) hours as needed.     No facility-administered medications prior to visit.    PAST MEDICAL HISTORY: Past Medical History:  Diagnosis Date   Anxiety    Hyperlipidemia    Hypertension    Hypothyroidism    Parkinson's disease (HCC)    Tremor    Vertigo     PAST SURGICAL HISTORY: Past Surgical History:  Procedure Laterality Date   DILATION AND CURETTAGE OF UTERUS     WISDOM TOOTH EXTRACTION      FAMILY HISTORY: Family History  Problem Relation Age of Onset   Hypertension Mother    Cancer Mother    CAD Father    Heart failure Sister    Heart failure Brother     SOCIAL HISTORY: Social History   Socioeconomic History   Marital status: Married    Spouse name: Not on file   Number of children: Not  on file   Years of education: Not on file   Highest education level: Not on file  Occupational History   Not on file  Tobacco Use   Smoking status: Former    Current packs/day: 0.00    Types: Cigarettes    Start date: 12/17/1947    Quit date: 12/16/1957    Years since quitting: 66.1   Smokeless tobacco: Never  Vaping Use   Vaping status: Never Used  Substance and Sexual Activity   Alcohol  use: No   Drug use: No   Sexual activity: Not Currently    Birth control/protection: Post-menopausal  Other Topics Concern   Not on file  Social History Narrative   Lives with husband   Social Drivers of Health   Financial Resource Strain: Low Risk  (07/31/2021)   Overall Financial Resource Strain  (CARDIA)    Difficulty of Paying Living Expenses: Not hard at all  Food Insecurity: No Food Insecurity (11/11/2023)   Hunger Vital Sign    Worried About Running Out of Food in the Last Year: Never true    Ran Out of Food in the Last Year: Never true  Transportation Needs: No Transportation Needs (11/11/2023)   PRAPARE - Administrator, Civil Service (Medical): No    Lack of Transportation (Non-Medical): No  Physical Activity: Inactive (07/31/2021)   Exercise Vital Sign    Days of Exercise per Week: 0 days    Minutes of Exercise per Session: 0 min  Stress: No Stress Concern Present (07/31/2021)   [name]    Feeling of Stress : Only a little  Social Connections: Moderately Integrated (11/11/2023)   Social Connection and Isolation Panel    Frequency of Communication with Friends and Family: More than three times a week    Frequency of Social Gatherings with Friends and Family: Twice a week    Attends Religious Services: More than 4 times per year    Active Member of [employer] or Organizations: Yes    Attends Banker Meetings: More than 4 times per year    Marital Status: Widowed  Intimate Partner Violence: Unknown (11/11/2023)   Humiliation, Afraid, Rape, and Kick questionnaire    Fear of Current or Ex-Partner: No    Emotionally Abused: No    Physically Abused: Not on file    Sexually Abused: No     PHYSICAL EXAM  GENERAL EXAM/CONSTITUTIONAL: Vitals:  Vitals:   01/11/24 1334  BP: (!) 100/53  Pulse: 93  Height: 4' 11 (1.499 m)   Body mass index is 24.76 kg/m. Wt Readings from Last 3 Encounters:  12/15/23 122 lb 9.6 oz (55.6 kg)  12/03/23 125 lb 11.2 oz (57 kg)  11/19/23 126 lb 6.4 oz (57.3 kg)   Patient is in no distress; well developed, nourished and groomed; neck is supple  CARDIOVASCULAR: Examination of carotid arteries is normal; no carotid bruits Regular rate and rhythm, no  murmurs Examination of peripheral vascular system by observation and palpation is normal  EYES: Ophthalmoscopic exam of optic discs and posterior segments is normal; no papilledema or hemorrhages No results found.  MUSCULOSKELETAL: Gait, strength, tone, movements noted in Neurologic exam below  NEUROLOGIC: MENTAL STATUS:      No data to display         awake, alert, oriented to person, place and time recent and remote memory intact normal attention and concentration language fluent, comprehension intact, naming intact fund of knowledge  appropriate  CRANIAL NERVE:  2nd - no papilledema on fundoscopic exam 2nd, 3rd, 4th, 6th - pupils equal and reactive to light, visual fields full to confrontation, extraocular muscles intact, no nystagmus 5th - facial sensation symmetric 7th - facial strength symmetric 8th - hearing intact 9th - palate elevates symmetrically, uvula midline 11th - shoulder shrug symmetric 12th - tongue protrusion midline MASKED FACIES  MOTOR:  MILD RARE, RESTING TREMOR IN L > R upper ext; BRADYKINESIA AND RIGIDITY IN BUE AND BLE (left worse than right) BUE 3, BLE 2-3; DIFFUSELY WEAK ATROPHY AND WEAKNESS IN BILATERAL HAND INTRINSICS  SENSORY:  normal and symmetric to light touch, temperature, vibration  COORDINATION:  finger-nose-finger, fine finger movements normal  REFLEXES:  deep tendon reflexes 1+ BUE; TRACE IN BLE  GAIT/STATION:  IN WHEELCHAIR     DIAGNOSTIC DATA (LABS, IMAGING, TESTING) - I reviewed patient records, labs, notes, testing and imaging myself where available.  Lab Results  Component Value Date   WBC 7.5 11/12/2023   HGB 13.1 11/12/2023   HCT 38.7 11/12/2023   MCV 90.6 11/12/2023   PLT 234 11/12/2023      Component Value Date/Time   NA 136 11/12/2023 0616   K 3.7 11/12/2023 0616   CL 102 11/12/2023 0616   CO2 25 11/12/2023 0616   GLUCOSE 100 (H) 11/12/2023 0616   BUN 11 11/12/2023 0616   CREATININE 0.82  11/12/2023 0616   CREATININE 0.92 02/07/2014 1219   CALCIUM  9.0 11/12/2023 0616   PROT 6.5 11/11/2023 0558   ALBUMIN 3.7 11/11/2023 0558   AST 19 11/11/2023 0558   ALT 8 11/11/2023 0558   ALKPHOS 109 11/11/2023 0558   BILITOT 0.7 11/11/2023 0558   GFRNONAA >60 11/12/2023 0616   GFRNONAA 62 02/07/2014 1219   GFRAA 62 (L) 06/17/2014 0957   GFRAA 71 02/07/2014 1219   No results found for: CHOL, HDL, LDLCALC, LDLDIRECT, TRIG, CHOLHDL No results found for: YHAJ8R No results found for: VITAMINB12 Lab Results  Component Value Date   TSH 3.995 12/17/2023    04/03/16 CT head  - Normal head CT for age.    ASSESSMENT AND PLAN  83 y.o. year old female here with:   Dx:  1. 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 (HCC)   2. Hand weakness   3. Physical deconditioning      PLAN:  PARKINSON'S DISEASE + deconditioning - continue carb/levo 1 tab three times a day  - order PT for gait training; order OT for hand weakness - safety / supervision issues reviewed - daily physical activity / exercise (at least 15-30 minutes) - increase social activities, brain stimulation, games, puzzles, hobbies, crafts, arts, music - aim for at least 7-8 hours sleep per night (or more) - avoid smoking and alcohol  - continue SNF treatment plan and supervision  Return for pending if symptoms worsen or fail to improve.     EDUARD FABIENE HANLON, MD 01/11/2024, 2:16 PM Certified in Neurology, Neurophysiology and Neuroimaging  [facility] [address] [place], [place] [address] [phone]

## 2024-01-11 NOTE — Patient Instructions (Signed)
  PARKINSON'S DISEASE + deconditioning - continue carb/levo 1 tab three times a day  - order PT for gait training; order OT for hand weakness - safety / supervision issues reviewed - daily physical activity / exercise (at least 15-30 minutes) - increase social activities, brain stimulation, games, puzzles, hobbies, crafts, arts, music - aim for at least 7-8 hours sleep per night (or more) - avoid smoking and alcohol  - continue SNF treatment plan and supervision

## 2024-01-13 DIAGNOSIS — E039 Hypothyroidism, unspecified: Secondary | ICD-10-CM | POA: Diagnosis not present

## 2024-01-13 DIAGNOSIS — E785 Hyperlipidemia, unspecified: Secondary | ICD-10-CM | POA: Diagnosis not present

## 2024-01-13 DIAGNOSIS — S42102D Fracture of unspecified part of scapula, left shoulder, subsequent encounter for fracture with routine healing: Secondary | ICD-10-CM | POA: Diagnosis not present

## 2024-01-13 DIAGNOSIS — G9609 Other spinal cerebrospinal fluid leak: Secondary | ICD-10-CM | POA: Diagnosis not present

## 2024-01-13 DIAGNOSIS — R41841 Cognitive communication deficit: Secondary | ICD-10-CM | POA: Diagnosis not present

## 2024-01-13 DIAGNOSIS — G20C Parkinsonism, unspecified: Secondary | ICD-10-CM | POA: Diagnosis not present

## 2024-01-13 DIAGNOSIS — Z9181 History of falling: Secondary | ICD-10-CM | POA: Diagnosis not present

## 2024-01-13 DIAGNOSIS — R262 Difficulty in walking, not elsewhere classified: Secondary | ICD-10-CM | POA: Diagnosis not present

## 2024-01-13 DIAGNOSIS — F339 Major depressive disorder, recurrent, unspecified: Secondary | ICD-10-CM | POA: Diagnosis not present

## 2024-01-18 ENCOUNTER — Non-Acute Institutional Stay (SKILLED_NURSING_FACILITY): Payer: Self-pay | Admitting: Adult Health

## 2024-01-18 ENCOUNTER — Encounter: Payer: Self-pay | Admitting: Adult Health

## 2024-01-18 DIAGNOSIS — R29818 Other symptoms and signs involving the nervous system: Secondary | ICD-10-CM

## 2024-01-18 DIAGNOSIS — D181 Lymphangioma, any site: Secondary | ICD-10-CM | POA: Diagnosis not present

## 2024-01-18 DIAGNOSIS — R4189 Other symptoms and signs involving cognitive functions and awareness: Secondary | ICD-10-CM

## 2024-01-18 DIAGNOSIS — K5909 Other constipation: Secondary | ICD-10-CM | POA: Diagnosis not present

## 2024-01-18 NOTE — Progress Notes (Signed)
 Location:  [facility] Room Number: 132 Place of Service:  SNF (31)   CODE STATUS: full   No Known Allergies  Chief Complaint  Patient presents with   Medical Management of Chronic Issues           Chronic constipation:    Hygroma:  Neurocognitive deficits    HPI:  She is a 83 y.o. long term resident of this facility being seen for the management of her chronic illnesses:Chronic constipation:    Hygroma:  Neurocognitive deficits. There are no reports of uncontrolled pain. She tells me that she is feeling good. There are no concerns about her chronic constipation; there is no significant changes in her cognition.     Past Medical History:  Diagnosis Date   Anxiety    Hyperlipidemia    Hypertension    Hypothyroidism    Parkinson's disease (HCC)    Tremor    Vertigo     Past Surgical History:  Procedure Laterality Date   DILATION AND CURETTAGE OF UTERUS     WISDOM TOOTH EXTRACTION      Social History   Socioeconomic History   Marital status: Married    Spouse name: Not on file   Number of children: Not on file   Years of education: Not on file   Highest education level: Not on file  Occupational History   Not on file  Tobacco Use   Smoking status: Former    Current packs/day: 0.00    Types: Cigarettes    Start date: 12/17/1947    Quit date: 12/16/1957    Years since quitting: 66.1   Smokeless tobacco: Never  Vaping Use   Vaping status: Never Used  Substance and Sexual Activity   Alcohol  use: No   Drug use: No   Sexual activity: Not Currently    Birth control/protection: Post-menopausal  Other Topics Concern   Not on file  Social History Narrative   Lives with husband   Social Drivers of Health   Financial Resource Strain: Low Risk  (07/31/2021)   Overall Financial Resource Strain (CARDIA)    Difficulty of Paying Living Expenses: Not hard at all  Food Insecurity: No Food Insecurity (11/11/2023)   Hunger Vital Sign    Worried  About Running Out of Food in the Last Year: Never true    Ran Out of Food in the Last Year: Never true  Transportation Needs: No Transportation Needs (11/11/2023)   PRAPARE - Administrator, Civil Service (Medical): No    Lack of Transportation (Non-Medical): No  Physical Activity: Inactive (07/31/2021)   Exercise Vital Sign    Days of Exercise per Week: 0 days    Minutes of Exercise per Session: 0 min  Stress: No Stress Concern Present (07/31/2021)   [name]    Feeling of Stress : Only a little  Social Connections: Moderately Integrated (11/11/2023)   Social Connection and Isolation Panel    Frequency of Communication with Friends and Family: More than three times a week    Frequency of Social Gatherings with Friends and Family: Twice a week    Attends Religious Services: More than 4 times per year    Active Member of [employer] or Organizations: Yes    Attends Banker Meetings: More than 4 times per year    Marital Status: Widowed  Intimate Partner Violence: Unknown (11/11/2023)   Humiliation, Afraid, Rape, and Kick questionnaire  Fear of Current or Ex-Partner: No    Emotionally Abused: No    Physically Abused: Not on file    Sexually Abused: No   Family History  Problem Relation Age of Onset   Hypertension Mother    Cancer Mother    CAD Father    Heart failure Sister    Heart failure Brother       VITAL SIGNS BP 136/84   Pulse 60   Temp (!) 97.4 F (36.3 C)   Resp 20   Ht 5' 2 (1.575 m)   Wt 126 lb 6.4 oz (57.3 kg)   SpO2 94%   BMI 23.12 kg/m   Outpatient Encounter Medications as of 01/18/2024  Medication Sig Note   acetaminophen  (TYLENOL ) 650 MG CR tablet Take 650 mg by mouth every 6 (six) hours.    carbidopa -levodopa  (SINEMET  IR) 25-100 MG tablet Take 1 tablet by mouth 3 (three) times daily before meals.    DULoxetine (CYMBALTA) 20 MG capsule Take 20 mg by mouth daily.     hydrochlorothiazide  (HYDRODIURIL ) 25 MG tablet Take by mouth. 11/11/2023: Pt has bottle of medication with her in a bag   levothyroxine  (SYNTHROID ) 100 MCG tablet Take 100 mcg by mouth every morning.    losartan  (COZAAR ) 100 MG tablet Take 100 mg by mouth daily.    ondansetron  (ZOFRAN -ODT) 4 MG disintegrating tablet Take 4 mg by mouth every 8 (eight) hours as needed for nausea or vomiting.    polyethylene glycol (MIRALAX  / GLYCOLAX ) 17 g packet Take 17 g by mouth daily.    traMADol  (ULTRAM ) 50 MG tablet Take by mouth every 6 (six) hours as needed.    No facility-administered encounter medications on file as of 01/18/2024.     SIGNIFICANT DIAGNOSTIC EXAMS  LABS REVIEWED:   11-11-23: wbc 12.3; hgb 14.2; hct 43.0; mcv 92.3 plt 259; glucose 111; bun 16 creat 0.92; k+ 3.5; na++ 137; ca 8.9; gfr >60; protein 6.5 albumin 3.7 11-12-23: wbc 7.3; hgb 13.1; hct 38.7; mcv 90.6 plt 234; glucose 100; bun 11; creat 0.82; k+ 3.7; na++ 136; ca 9.0; gfr >60 12-17-23: tsh 3.995  Review of Systems  Constitutional:  Negative for malaise/fatigue.  Respiratory:  Negative for cough and shortness of breath.   Cardiovascular:  Negative for chest pain, palpitations and leg swelling.  Gastrointestinal:  Negative for abdominal pain, constipation and heartburn.  Musculoskeletal:  Negative for back pain, joint pain and myalgias.  Skin: Negative.   Neurological:  Negative for dizziness.  Psychiatric/Behavioral:  The patient is not nervous/anxious.     Physical Exam Constitutional:      General: She is not in acute distress.    Appearance: She is well-developed. She is not diaphoretic.  Neck:     Thyroid : No thyromegaly.  Cardiovascular:     Rate and Rhythm: Normal rate and regular rhythm.     Heart sounds: Normal heart sounds.  Pulmonary:     Effort: Pulmonary effort is normal. No respiratory distress.     Breath sounds: Normal breath sounds.  Abdominal:     General: Bowel sounds are normal. There is no  distension.     Palpations: Abdomen is soft.     Tenderness: There is no abdominal tenderness.  Musculoskeletal:        General: Normal range of motion.     Cervical back: Neck supple.     Right lower leg: No edema.     Left lower leg: No edema.  Lymphadenopathy:  Cervical: No cervical adenopathy.  Skin:    General: Skin is warm and dry.  Neurological:     Mental Status: She is alert. Mental status is at baseline.  Psychiatric:        Mood and Affect: Mood normal.      ASSESSMENT/ PLAN:  TODAY  Chronic constipation: will continue miralax  daily   2. Hygroma: is followed by Dr. Debby: will need repeat ct scan in the next 2 months  3. Neurocognitive deficits: SLUMS 25/30   PREVIOUS   4. Major depression: is taking cymbalta 20 mg daily   5. Acquired hypothyroidism: tsh 3.995 will continue synthroid  100 mcg daily   6. Primary hypertension: b/p 136/84 will continue hydrochlorothiazide  25 mg daily cozaar  100 mg daily   7. Parkinson's disease without dyskinesia  or fluctuating manifestations: will continue sinemet  25/100 mg three times daily     Barnie Seip NP [facility]  call [phone]

## 2024-01-20 DIAGNOSIS — I10 Essential (primary) hypertension: Secondary | ICD-10-CM | POA: Diagnosis not present

## 2024-01-20 DIAGNOSIS — Z9181 History of falling: Secondary | ICD-10-CM | POA: Diagnosis not present

## 2024-01-20 DIAGNOSIS — Z23 Encounter for immunization: Secondary | ICD-10-CM | POA: Diagnosis not present

## 2024-01-20 DIAGNOSIS — R41841 Cognitive communication deficit: Secondary | ICD-10-CM | POA: Diagnosis not present

## 2024-01-20 DIAGNOSIS — G9609 Other spinal cerebrospinal fluid leak: Secondary | ICD-10-CM | POA: Diagnosis not present

## 2024-01-20 DIAGNOSIS — S42102D Fracture of unspecified part of scapula, left shoulder, subsequent encounter for fracture with routine healing: Secondary | ICD-10-CM | POA: Diagnosis not present

## 2024-01-20 DIAGNOSIS — R262 Difficulty in walking, not elsewhere classified: Secondary | ICD-10-CM | POA: Diagnosis not present

## 2024-01-20 DIAGNOSIS — G20C Parkinsonism, unspecified: Secondary | ICD-10-CM | POA: Diagnosis not present

## 2024-01-20 DIAGNOSIS — E039 Hypothyroidism, unspecified: Secondary | ICD-10-CM | POA: Diagnosis not present

## 2024-01-20 DIAGNOSIS — F339 Major depressive disorder, recurrent, unspecified: Secondary | ICD-10-CM | POA: Diagnosis not present

## 2024-01-24 DIAGNOSIS — E039 Hypothyroidism, unspecified: Secondary | ICD-10-CM | POA: Diagnosis not present

## 2024-01-24 DIAGNOSIS — R41841 Cognitive communication deficit: Secondary | ICD-10-CM | POA: Diagnosis not present

## 2024-01-24 DIAGNOSIS — F339 Major depressive disorder, recurrent, unspecified: Secondary | ICD-10-CM | POA: Diagnosis not present

## 2024-01-24 DIAGNOSIS — Z9181 History of falling: Secondary | ICD-10-CM | POA: Diagnosis not present

## 2024-01-24 DIAGNOSIS — G20C Parkinsonism, unspecified: Secondary | ICD-10-CM | POA: Diagnosis not present

## 2024-01-24 DIAGNOSIS — G9609 Other spinal cerebrospinal fluid leak: Secondary | ICD-10-CM | POA: Diagnosis not present

## 2024-01-24 DIAGNOSIS — I10 Essential (primary) hypertension: Secondary | ICD-10-CM | POA: Diagnosis not present

## 2024-01-24 DIAGNOSIS — R262 Difficulty in walking, not elsewhere classified: Secondary | ICD-10-CM | POA: Diagnosis not present

## 2024-01-24 DIAGNOSIS — S42102D Fracture of unspecified part of scapula, left shoulder, subsequent encounter for fracture with routine healing: Secondary | ICD-10-CM | POA: Diagnosis not present

## 2024-01-25 DIAGNOSIS — G20C Parkinsonism, unspecified: Secondary | ICD-10-CM | POA: Diagnosis not present

## 2024-01-25 DIAGNOSIS — F339 Major depressive disorder, recurrent, unspecified: Secondary | ICD-10-CM | POA: Diagnosis not present

## 2024-01-25 DIAGNOSIS — I10 Essential (primary) hypertension: Secondary | ICD-10-CM | POA: Diagnosis not present

## 2024-01-25 DIAGNOSIS — Z9181 History of falling: Secondary | ICD-10-CM | POA: Diagnosis not present

## 2024-01-25 DIAGNOSIS — S42102D Fracture of unspecified part of scapula, left shoulder, subsequent encounter for fracture with routine healing: Secondary | ICD-10-CM | POA: Diagnosis not present

## 2024-01-25 DIAGNOSIS — G9609 Other spinal cerebrospinal fluid leak: Secondary | ICD-10-CM | POA: Diagnosis not present

## 2024-01-25 DIAGNOSIS — R41841 Cognitive communication deficit: Secondary | ICD-10-CM | POA: Diagnosis not present

## 2024-01-25 DIAGNOSIS — R262 Difficulty in walking, not elsewhere classified: Secondary | ICD-10-CM | POA: Diagnosis not present

## 2024-01-25 DIAGNOSIS — E039 Hypothyroidism, unspecified: Secondary | ICD-10-CM | POA: Diagnosis not present

## 2024-01-26 DIAGNOSIS — H524 Presbyopia: Secondary | ICD-10-CM | POA: Diagnosis not present

## 2024-01-26 DIAGNOSIS — H25813 Combined forms of age-related cataract, bilateral: Secondary | ICD-10-CM | POA: Diagnosis not present

## 2024-01-27 DIAGNOSIS — R262 Difficulty in walking, not elsewhere classified: Secondary | ICD-10-CM | POA: Diagnosis not present

## 2024-01-27 DIAGNOSIS — Z9181 History of falling: Secondary | ICD-10-CM | POA: Diagnosis not present

## 2024-01-27 DIAGNOSIS — I10 Essential (primary) hypertension: Secondary | ICD-10-CM | POA: Diagnosis not present

## 2024-01-27 DIAGNOSIS — G20C Parkinsonism, unspecified: Secondary | ICD-10-CM | POA: Diagnosis not present

## 2024-01-27 DIAGNOSIS — F339 Major depressive disorder, recurrent, unspecified: Secondary | ICD-10-CM | POA: Diagnosis not present

## 2024-01-27 DIAGNOSIS — G9609 Other spinal cerebrospinal fluid leak: Secondary | ICD-10-CM | POA: Diagnosis not present

## 2024-01-27 DIAGNOSIS — R41841 Cognitive communication deficit: Secondary | ICD-10-CM | POA: Diagnosis not present

## 2024-01-27 DIAGNOSIS — S42102D Fracture of unspecified part of scapula, left shoulder, subsequent encounter for fracture with routine healing: Secondary | ICD-10-CM | POA: Diagnosis not present

## 2024-01-31 DIAGNOSIS — S42102D Fracture of unspecified part of scapula, left shoulder, subsequent encounter for fracture with routine healing: Secondary | ICD-10-CM | POA: Diagnosis not present

## 2024-01-31 DIAGNOSIS — I10 Essential (primary) hypertension: Secondary | ICD-10-CM | POA: Diagnosis not present

## 2024-01-31 DIAGNOSIS — R41841 Cognitive communication deficit: Secondary | ICD-10-CM | POA: Diagnosis not present

## 2024-01-31 DIAGNOSIS — R262 Difficulty in walking, not elsewhere classified: Secondary | ICD-10-CM | POA: Diagnosis not present

## 2024-01-31 DIAGNOSIS — G9609 Other spinal cerebrospinal fluid leak: Secondary | ICD-10-CM | POA: Diagnosis not present

## 2024-01-31 DIAGNOSIS — E039 Hypothyroidism, unspecified: Secondary | ICD-10-CM | POA: Diagnosis not present

## 2024-01-31 DIAGNOSIS — G20C Parkinsonism, unspecified: Secondary | ICD-10-CM | POA: Diagnosis not present

## 2024-01-31 DIAGNOSIS — F339 Major depressive disorder, recurrent, unspecified: Secondary | ICD-10-CM | POA: Diagnosis not present

## 2024-01-31 DIAGNOSIS — Z9181 History of falling: Secondary | ICD-10-CM | POA: Diagnosis not present

## 2024-02-02 DIAGNOSIS — R41841 Cognitive communication deficit: Secondary | ICD-10-CM | POA: Diagnosis not present

## 2024-02-02 DIAGNOSIS — G20C Parkinsonism, unspecified: Secondary | ICD-10-CM | POA: Diagnosis not present

## 2024-02-02 DIAGNOSIS — R262 Difficulty in walking, not elsewhere classified: Secondary | ICD-10-CM | POA: Diagnosis not present

## 2024-02-02 DIAGNOSIS — Z9181 History of falling: Secondary | ICD-10-CM | POA: Diagnosis not present

## 2024-02-02 DIAGNOSIS — S42102D Fracture of unspecified part of scapula, left shoulder, subsequent encounter for fracture with routine healing: Secondary | ICD-10-CM | POA: Diagnosis not present

## 2024-02-02 DIAGNOSIS — E039 Hypothyroidism, unspecified: Secondary | ICD-10-CM | POA: Diagnosis not present

## 2024-02-02 DIAGNOSIS — G9609 Other spinal cerebrospinal fluid leak: Secondary | ICD-10-CM | POA: Diagnosis not present

## 2024-02-02 DIAGNOSIS — I10 Essential (primary) hypertension: Secondary | ICD-10-CM | POA: Diagnosis not present

## 2024-02-02 DIAGNOSIS — F339 Major depressive disorder, recurrent, unspecified: Secondary | ICD-10-CM | POA: Diagnosis not present

## 2024-02-03 DIAGNOSIS — G20C Parkinsonism, unspecified: Secondary | ICD-10-CM | POA: Diagnosis not present

## 2024-02-03 DIAGNOSIS — Z9181 History of falling: Secondary | ICD-10-CM | POA: Diagnosis not present

## 2024-02-03 DIAGNOSIS — R262 Difficulty in walking, not elsewhere classified: Secondary | ICD-10-CM | POA: Diagnosis not present

## 2024-02-03 DIAGNOSIS — E039 Hypothyroidism, unspecified: Secondary | ICD-10-CM | POA: Diagnosis not present

## 2024-02-03 DIAGNOSIS — R41841 Cognitive communication deficit: Secondary | ICD-10-CM | POA: Diagnosis not present

## 2024-02-03 DIAGNOSIS — G9609 Other spinal cerebrospinal fluid leak: Secondary | ICD-10-CM | POA: Diagnosis not present

## 2024-02-03 DIAGNOSIS — S42102D Fracture of unspecified part of scapula, left shoulder, subsequent encounter for fracture with routine healing: Secondary | ICD-10-CM | POA: Diagnosis not present

## 2024-02-03 DIAGNOSIS — F339 Major depressive disorder, recurrent, unspecified: Secondary | ICD-10-CM | POA: Diagnosis not present

## 2024-02-03 DIAGNOSIS — I10 Essential (primary) hypertension: Secondary | ICD-10-CM | POA: Diagnosis not present

## 2024-02-08 DIAGNOSIS — S42102D Fracture of unspecified part of scapula, left shoulder, subsequent encounter for fracture with routine healing: Secondary | ICD-10-CM | POA: Diagnosis not present

## 2024-02-08 DIAGNOSIS — F339 Major depressive disorder, recurrent, unspecified: Secondary | ICD-10-CM | POA: Diagnosis not present

## 2024-02-08 DIAGNOSIS — E039 Hypothyroidism, unspecified: Secondary | ICD-10-CM | POA: Diagnosis not present

## 2024-02-08 DIAGNOSIS — R262 Difficulty in walking, not elsewhere classified: Secondary | ICD-10-CM | POA: Diagnosis not present

## 2024-02-08 DIAGNOSIS — Z9181 History of falling: Secondary | ICD-10-CM | POA: Diagnosis not present

## 2024-02-08 DIAGNOSIS — R41841 Cognitive communication deficit: Secondary | ICD-10-CM | POA: Diagnosis not present

## 2024-02-08 DIAGNOSIS — G9609 Other spinal cerebrospinal fluid leak: Secondary | ICD-10-CM | POA: Diagnosis not present

## 2024-02-08 DIAGNOSIS — G20C Parkinsonism, unspecified: Secondary | ICD-10-CM | POA: Diagnosis not present

## 2024-02-08 DIAGNOSIS — I10 Essential (primary) hypertension: Secondary | ICD-10-CM | POA: Diagnosis not present

## 2024-02-09 DIAGNOSIS — G20C Parkinsonism, unspecified: Secondary | ICD-10-CM | POA: Diagnosis not present

## 2024-02-09 DIAGNOSIS — Z9181 History of falling: Secondary | ICD-10-CM | POA: Diagnosis not present

## 2024-02-09 DIAGNOSIS — F339 Major depressive disorder, recurrent, unspecified: Secondary | ICD-10-CM | POA: Diagnosis not present

## 2024-02-09 DIAGNOSIS — R41841 Cognitive communication deficit: Secondary | ICD-10-CM | POA: Diagnosis not present

## 2024-02-09 DIAGNOSIS — E039 Hypothyroidism, unspecified: Secondary | ICD-10-CM | POA: Diagnosis not present

## 2024-02-09 DIAGNOSIS — R262 Difficulty in walking, not elsewhere classified: Secondary | ICD-10-CM | POA: Diagnosis not present

## 2024-02-09 DIAGNOSIS — I10 Essential (primary) hypertension: Secondary | ICD-10-CM | POA: Diagnosis not present

## 2024-02-09 DIAGNOSIS — G9609 Other spinal cerebrospinal fluid leak: Secondary | ICD-10-CM | POA: Diagnosis not present

## 2024-02-10 ENCOUNTER — Non-Acute Institutional Stay (SKILLED_NURSING_FACILITY): Payer: Self-pay | Admitting: Adult Health

## 2024-02-10 ENCOUNTER — Encounter: Payer: Self-pay | Admitting: Adult Health

## 2024-02-10 DIAGNOSIS — R41841 Cognitive communication deficit: Secondary | ICD-10-CM | POA: Diagnosis not present

## 2024-02-10 DIAGNOSIS — F321 Major depressive disorder, single episode, moderate: Secondary | ICD-10-CM

## 2024-02-10 DIAGNOSIS — R262 Difficulty in walking, not elsewhere classified: Secondary | ICD-10-CM | POA: Diagnosis not present

## 2024-02-10 DIAGNOSIS — G20C Parkinsonism, unspecified: Secondary | ICD-10-CM | POA: Diagnosis not present

## 2024-02-10 DIAGNOSIS — S42102D Fracture of unspecified part of scapula, left shoulder, subsequent encounter for fracture with routine healing: Secondary | ICD-10-CM | POA: Diagnosis not present

## 2024-02-10 DIAGNOSIS — F339 Major depressive disorder, recurrent, unspecified: Secondary | ICD-10-CM | POA: Diagnosis not present

## 2024-02-10 DIAGNOSIS — Z9181 History of falling: Secondary | ICD-10-CM | POA: Diagnosis not present

## 2024-02-10 DIAGNOSIS — E039 Hypothyroidism, unspecified: Secondary | ICD-10-CM | POA: Diagnosis not present

## 2024-02-10 DIAGNOSIS — I10 Essential (primary) hypertension: Secondary | ICD-10-CM | POA: Diagnosis not present

## 2024-02-10 DIAGNOSIS — G9609 Other spinal cerebrospinal fluid leak: Secondary | ICD-10-CM | POA: Diagnosis not present

## 2024-02-10 NOTE — Progress Notes (Signed)
 Location:  [facility] Room Number: [id number] Place of Service:  SNF (31)   CODE STATUS: full   No Known Allergies  Chief Complaint  Patient presents with   Medical Management of Chronic Issues             Major depression:   Acquired hypothyroidism:    Primary hypertension    HPI:  She is a 83 y.o. long term resident of this facility being seen for the management of her chronic illnesses:Major depression:   Acquired hypothyroidism:    Primary hypertension. There are no reports of uncontrolled pain. There have been no reports of depressive thoughts. There have been no recent falls.    Past Medical History:  Diagnosis Date   Anxiety    Hyperlipidemia    Hypertension    Hypothyroidism    Parkinson's disease (HCC)    Tremor    Vertigo     Past Surgical History:  Procedure Laterality Date   DILATION AND CURETTAGE OF UTERUS     WISDOM TOOTH EXTRACTION      Social History   Socioeconomic History   Marital status: Married    Spouse name: Not on file   Number of children: Not on file   Years of education: Not on file   Highest education level: Not on file  Occupational History   Not on file  Tobacco Use   Smoking status: Former    Current packs/day: 0.00    Types: Cigarettes    Start date: 12/17/1947    Quit date: 12/16/1957    Years since quitting: 66.1   Smokeless tobacco: Never  Vaping Use   Vaping status: Never Used  Substance and Sexual Activity   Alcohol  use: No   Drug use: No   Sexual activity: Not Currently    Birth control/protection: Post-menopausal  Other Topics Concern   Not on file  Social History Narrative   Lives with husband   Social Drivers of Health   Financial Resource Strain: Low Risk  (07/31/2021)   Overall Financial Resource Strain (CARDIA)    Difficulty of Paying Living Expenses: Not hard at all  Food Insecurity: No Food Insecurity (11/11/2023)   Hunger Vital Sign    Worried About Running Out of Food in the Last  Year: Never true    Ran Out of Food in the Last Year: Never true  Transportation Needs: No Transportation Needs (11/11/2023)   PRAPARE - Administrator, Civil Service (Medical): No    Lack of Transportation (Non-Medical): No  Physical Activity: Inactive (07/31/2021)   Exercise Vital Sign    Days of Exercise per Week: 0 days    Minutes of Exercise per Session: 0 min  Stress: No Stress Concern Present (07/31/2021)   [name]    Feeling of Stress : Only a little  Social Connections: Moderately Integrated (11/11/2023)   Social Connection and Isolation Panel    Frequency of Communication with Friends and Family: More than three times a week    Frequency of Social Gatherings with Friends and Family: Twice a week    Attends Religious Services: More than 4 times per year    Active Member of [employer] or Organizations: Yes    Attends Banker Meetings: More than 4 times per year    Marital Status: Widowed  Intimate Partner Violence: Unknown (11/11/2023)   Humiliation, Afraid, Rape, and Kick questionnaire    Fear of  Current or Ex-Partner: No    Emotionally Abused: No    Physically Abused: Not on file    Sexually Abused: No   Family History  Problem Relation Age of Onset   Hypertension Mother    Cancer Mother    CAD Father    Heart failure Sister    Heart failure Brother       VITAL SIGNS BP (!) 148/90   Pulse 85   Temp 97.7 F (36.5 C)   Resp 19   Ht 5' 2 (1.575 m)   Wt 124 lb 9.6 oz (56.5 kg)   SpO2 99%   BMI 22.79 kg/m   Outpatient Encounter Medications as of 02/10/2024  Medication Sig Note   acetaminophen  (TYLENOL ) 650 MG CR tablet Take 650 mg by mouth every 6 (six) hours.    carbidopa -levodopa  (SINEMET  IR) 25-100 MG tablet Take 1 tablet by mouth 3 (three) times daily before meals.    DULoxetine (CYMBALTA) 20 MG capsule Take 20 mg by mouth daily.    hydrochlorothiazide  (HYDRODIURIL ) 25  MG tablet Take by mouth. 11/11/2023: Pt has bottle of medication with her in a bag   levothyroxine  (SYNTHROID ) 100 MCG tablet Take 100 mcg by mouth every morning.    losartan  (COZAAR ) 100 MG tablet Take 100 mg by mouth daily.    ondansetron  (ZOFRAN -ODT) 4 MG disintegrating tablet Take 4 mg by mouth every 8 (eight) hours as needed for nausea or vomiting.    polyethylene glycol (MIRALAX  / GLYCOLAX ) 17 g packet Take 17 g by mouth daily.    traMADol  (ULTRAM ) 50 MG tablet Take by mouth every 6 (six) hours as needed.    No facility-administered encounter medications on file as of 02/10/2024.     SIGNIFICANT DIAGNOSTIC EXAMS  LABS REVIEWED:   11-11-23: wbc 12.3; hgb 14.2; hct 43.0; mcv 92.3 plt 259; glucose 111; bun 16 creat 0.92; k+ 3.5; na++ 137; ca 8.9; gfr >60; protein 6.5 albumin 3.7 11-12-23: wbc 7.3; hgb 13.1; hct 38.7; mcv 90.6 plt 234; glucose 100; bun 11; creat 0.82; k+ 3.7; na++ 136; ca 9.0; gfr >60 12-17-23: tsh 3.995  Review of Systems  Constitutional:  Negative for malaise/fatigue.  Respiratory:  Negative for cough and shortness of breath.   Cardiovascular:  Negative for chest pain, palpitations and leg swelling.  Gastrointestinal:  Negative for abdominal pain, constipation and heartburn.  Musculoskeletal:  Negative for back pain, joint pain and myalgias.  Skin: Negative.   Neurological:  Negative for dizziness.  Psychiatric/Behavioral:  The patient is not nervous/anxious.     Physical Exam Constitutional:      General: She is not in acute distress.    Appearance: She is well-developed. She is not diaphoretic.  Neck:     Thyroid : No thyromegaly.  Cardiovascular:     Rate and Rhythm: Normal rate and regular rhythm.     Heart sounds: Normal heart sounds.  Pulmonary:     Effort: Pulmonary effort is normal. No respiratory distress.     Breath sounds: Normal breath sounds.  Abdominal:     General: Bowel sounds are normal. There is no distension.     Palpations: Abdomen is  soft.     Tenderness: There is no abdominal tenderness.  Musculoskeletal:        General: Normal range of motion.     Cervical back: Neck supple.     Right lower leg: No edema.     Left lower leg: No edema.  Lymphadenopathy:  Cervical: No cervical adenopathy.  Skin:    General: Skin is warm and dry.  Neurological:     Mental Status: She is alert. Mental status is at baseline.  Psychiatric:        Mood and Affect: Mood normal.     ASSESSMENT/ PLAN:  TODAY  Major depression: is stable on cymbalta 20 mg daily   2. Acquired hypothyroidism: tsh 3.995 will continue synthroid  100 mcg daily   3. Primary hypertension: b/p 148/90 will continue hydrochlorothiazide  25 mg daily cozaar  100 mg daily    PREVIOUS   4. 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 Chronic constipation: will continue miralax  daily   6. Hygroma: is followed by Dr. Debby: will need repeat ct scan in the next 2 months  7. Neurocognitive deficits: SLUMS 25/30       Barnie Seip NP [facility]  call [phone]

## 2024-02-17 ENCOUNTER — Non-Acute Institutional Stay (SKILLED_NURSING_FACILITY): Payer: Self-pay | Admitting: Adult Health

## 2024-02-17 ENCOUNTER — Encounter: Payer: Self-pay | Admitting: Adult Health

## 2024-02-17 DIAGNOSIS — Z0000 Encounter for general adult medical examination without abnormal findings: Secondary | ICD-10-CM

## 2024-02-17 NOTE — Progress Notes (Signed)
 Subjective:   Amber Rosales is a 83 y.o. female who presents for Medicare Annual (Subsequent) preventive examination.  Visit Complete: In person  Patient Medicare AWV questionnaire was completed by the patient on [id number]; I have confirmed that all information answered by patient is correct and no changes since this date.  Cardiac Risk Factors include: advanced age (>38men, >19 women);hypertension;sedentary lifestyle     Objective:    Today's Vitals   02/17/24 1402  BP: (!) 116/58  Pulse: 87  Resp: 20  Temp: 98.2 F (36.8 C)  SpO2: 97%  Weight: 124 lb 9.6 oz (56.5 kg)  Height: 5' 2 (1.575 m)   Body mass index is 22.79 kg/m.     02/12/2023    1:51 PM 04/03/2016    3:32 PM 06/17/2014    9:40 AM 12/16/2013   11:17 PM  Advanced Directives  Does Patient Have a Medical Advance Directive? Yes No  No  No   Would patient like information on creating a medical advance directive?  No - Patient declined  Yes - Educational materials given  No - patient declined information      Data saved with a previous flowsheet row definition    Current Medications (verified) Outpatient Encounter Medications as of 02/17/2024  Medication Sig   acetaminophen  (TYLENOL ) 650 MG CR tablet Take 650 mg by mouth every 6 (six) hours.   carbidopa -levodopa  (SINEMET  IR) 25-100 MG tablet Take 1 tablet by mouth 3 (three) times daily before meals.   DULoxetine (CYMBALTA) 20 MG capsule Take 20 mg by mouth daily.   hydrochlorothiazide  (HYDRODIURIL ) 25 MG tablet Take by mouth.   levothyroxine  (SYNTHROID ) 100 MCG tablet Take 100 mcg by mouth every morning.   losartan  (COZAAR ) 100 MG tablet Take 100 mg by mouth daily.   ondansetron  (ZOFRAN -ODT) 4 MG disintegrating tablet Take 4 mg by mouth every 8 (eight) hours as needed for nausea or vomiting.   polyethylene glycol (MIRALAX  / GLYCOLAX ) 17 g packet Take 17 g by mouth daily.   traMADol  (ULTRAM ) 50 MG tablet Take by mouth every 6 (six) hours as needed.   No  facility-administered encounter medications on file as of 02/17/2024.    Allergies (verified) Patient has no known allergies.   History: Past Medical History:  Diagnosis Date   Anxiety    Hyperlipidemia    Hypertension    Hypothyroidism    Parkinson's disease (HCC)    Tremor    Vertigo    Past Surgical History:  Procedure Laterality Date   DILATION AND CURETTAGE OF UTERUS     WISDOM TOOTH EXTRACTION     Family History  Problem Relation Age of Onset   Hypertension Mother    Cancer Mother    CAD Father    Heart failure Sister    Heart failure Brother    Social History   Socioeconomic History   Marital status: Married    Spouse name: Not on file   Number of children: Not on file   Years of education: Not on file   Highest education level: Not on file  Occupational History   Not on file  Tobacco Use   Smoking status: Former    Current packs/day: 0.00    Types: Cigarettes    Start date: 12/17/1947    Quit date: 12/16/1957    Years since quitting: 66.2   Smokeless tobacco: Never  Vaping Use   Vaping status: Never Used  Substance and Sexual Activity   Alcohol  use: No  Drug use: No   Sexual activity: Not Currently    Birth control/protection: Post-menopausal  Other Topics Concern   Not on file  Social History Narrative   Lives with husband   Social Drivers of Health   Financial Resource Strain: Low Risk  (07/31/2021)   Overall Financial Resource Strain (CARDIA)    Difficulty of Paying Living Expenses: Not hard at all  Food Insecurity: No Food Insecurity (11/11/2023)   Hunger Vital Sign    Worried About Running Out of Food in the Last Year: Never true    Ran Out of Food in the Last Year: Never true  Transportation Needs: No Transportation Needs (11/11/2023)   PRAPARE - Administrator, Civil Service (Medical): No    Lack of Transportation (Non-Medical): No  Physical Activity: Inactive (07/31/2021)   Exercise Vital Sign    Days of Exercise per  Week: 0 days    Minutes of Exercise per Session: 0 min  Stress: No Stress Concern Present (07/31/2021)   [name]    Feeling of Stress : Only a little  Social Connections: Moderately Integrated (11/11/2023)   Social Connection and Isolation Panel    Frequency of Communication with Friends and Family: More than three times a week    Frequency of Social Gatherings with Friends and Family: Twice a week    Attends Religious Services: More than 4 times per year    Active Member of [employer] or Organizations: Yes    Attends Banker Meetings: More than 4 times per year    Marital Status: Widowed    Tobacco Counseling Counseling given: Not Answered   Clinical Intake:  Pre-visit preparation completed: Yes  Pain : No/denies pain     BMI - recorded: 22.79 Nutritional Status: BMI of 19-24  Normal Nutritional Risks: Unintentional weight loss Diabetes: No  How often do you need to have someone help you when you read instructions, pamphlets, or other written materials from your doctor or pharmacy?: 5 - Always  Interpreter Needed?: No      Activities of Daily Living    02/17/2024    2:09 PM  In your present state of health, do you have any difficulty performing the following activities:  Hearing? 0  Vision? 0  Difficulty concentrating or making decisions? 1  Walking or climbing stairs? 1  Dressing or bathing? 1  Doing errands, shopping? 1  Preparing Food and eating ? Y  Using the Toilet? Y  In the past six months, have you accidently leaked urine? Y  Do you have problems with loss of bowel control? Y  Managing your Medications? Y  Managing your Finances? Y  Housekeeping or managing your Housekeeping? Y    Patient Care Team: Sasser, Deward ORN, MD as PCP - General (Family Medicine) Alvan Dorn FALCON, MD as PCP - Cardiology (Cardiology)  Indicate any recent Medical Services you may have received from  other than [facility] providers in the past year (date may be approximate).     Assessment:   This is a routine wellness examination for Amber Rosales.  Hearing/Vision screen No results found.   Goals Addressed             This Visit's Progress    Absence of Fall and Fall-Related Injury       Evidence-based guidance:  Assess fall risk using a validated tool when available. Consider balance and gait impairment, muscle weakness, diminished vision or hearing, environmental hazards,  presence of urinary or bowel urgency and/or incontinence.  Communicate fall injury risk to interprofessional healthcare team.  Develop a fall prevention plan with the patient and family.  Promote use of personal vision and auditory aids.  Promote reorientation, appropriate sensory stimulation, and routines to decrease risk of fall when changes in mental status are present.  Assess assistance level required for safe and effective self-care; consider referral for home care.  Encourage physical activity, such as performance of self-care at highest level of ability, strength and balance exercise program, and provision of appropriate assistive devices; refer to rehabilitation therapy.  Refer to community-based fall prevention program where available.  If fall occurs, determine the cause and revise fall injury prevention plan.  Regularly review medication contribution to fall risk; consider risk related to polypharmacy and age.  Refer to pharmacist for consultation when concerns about medications are revealed.  Balance adequate pain management with potential for oversedation.  Provide guidance related to environmental modifications.  Consider supplementation with Vitamin D.   Notes:      General - Client will not be readmitted within 30 days (C-SNP)       General - Client will not be readmitted within 30 days (C-SNP)         Depression Screen    02/17/2024    2:11 PM 07/31/2021    9:36 AM  PHQ 2/9 Scores  PHQ - 2  Score 0 0  PHQ- 9 Score  1    Fall Risk    02/17/2024    2:11 PM 11/08/2023    3:08 PM 03/15/2023    2:07 PM 07/31/2021   10:13 AM 03/08/2018    6:16 PM  Fall Risk   Falls in the past year? 0 0 1 0 0   Comment     Emmi Telephone Survey: data to providers prior to load   Number falls in past yr: 0 0 0 0   Injury with Fall? 0 0 0 0      Data saved with a previous flowsheet row definition    MEDICARE RISK AT HOME: Medicare Risk at Home Any stairs in or around the home?: Yes If so, are there any without handrails?: No Home free of loose throw rugs in walkways, pet beds, electrical cords, etc?: Yes Adequate lighting in your home to reduce risk of falls?: Yes Life alert?: No Use of a cane, walker or w/c?: Yes Grab bars in the bathroom?: Yes Shower chair or bench in shower?: Yes Elevated toilet seat or a handicapped toilet?: Yes  TIMED UP AND GO:  Was the test performed?  No    Cognitive Function:        02/17/2024    2:11 PM  6CIT Screen  What Year? 0 points  What month? 0 points  What time? 3 points  Count back from 20 2 points  Months in reverse 4 points  Repeat phrase 6 points  Total Score 15 points    Immunizations Immunization History  Administered Date(s) Administered    sv, Bivalent, Protein Subunit Rsvpref,pf [place]) 09/08/2022   Influenza-Unspecified 02/15/2023, 01/20/2024   Moderna Sars-Covid-2 Vaccination 05/02/2019, 06/12/2019, 01/31/2024   PNEUMOCOCCAL CONJUGATE-20 06/29/2023   Tdap 03/10/2023   Unspecified SARS-COV-2 Vaccination 02/25/2021   Zoster Recombinant(Shingrix) 11/03/2018, 09/08/2022, 03/10/2023    TDAP status: Up to date  Flu Vaccine status: Up to date  Pneumococcal vaccine status: Up to date  Covid-19 vaccine status: Completed vaccines  Qualifies for Shingles Vaccine?   Zostavax completed  Screening Tests Health Maintenance  Topic Date Due   DEXA SCAN  Never done   COVID-19 Vaccine (5 - 2025-26 season) 03/27/2024    DTaP/Tdap/Td (2 - Td or Tdap) 03/09/2033   Pneumococcal Vaccine: 50+ Years  Completed   Influenza Vaccine  Completed   Zoster Vaccines- Shingrix  Completed   Meningococcal B Vaccine  Aged Out    Health Maintenance  Health Maintenance Due  Topic Date Due   DEXA SCAN  Never done    Colorectal cancer screening: No longer required.   Mammogram status: No longer required due to age.    Lung Cancer Screening: (Low Dose CT Chest recommended if Age 75-80 years, 20 pack-year currently smoking OR have quit w/in 15years.) does not qualify.   Lung Cancer Screening Referral:   Additional Screening:  Hepatitis C Screening: does not qualify; Completed   Vision Screening: Recommended annual ophthalmology exams for early detection of glaucoma and other disorders of the eye. Is the patient up to date with their annual eye exam?  No  Who is the provider or what is the name of the office in which the patient attends annual eye exams?  If pt is not established with a provider, would they like to be referred to a provider to establish care? No .   Dental Screening: Recommended annual dental exams for proper oral hygiene  Diabetic Foot Exam:   Community Resource Referral / Chronic Care Management: CRR required this visit?  No   CCM required this visit?  No     Plan:     I have personally reviewed and noted the following in the patient's chart:   Medical and social history Use of alcohol , tobacco or illicit drugs  Current medications and supplements including opioid prescriptions. Patient is not currently taking opioid prescriptions. Functional ability and status Nutritional status Physical activity Advanced directives List of other physicians Hospitalizations, surgeries, and ER visits in previous 12 months Vitals Screenings to include cognitive, depression, and falls Referrals and appointments  In addition, I have reviewed and discussed with patient certain preventive  protocols, quality metrics, and best practice recommendations. A written personalized care plan for preventive services as well as general preventive health recommendations were provided to patient.     Barnie GORMAN Seip, NP   02/17/2024   After Visit Summary: (In Person-Declined) Patient declined AVS at this time.  Nurse Notes: the exam has been performed by myself at this facility

## 2024-02-21 DIAGNOSIS — M2041 Other hammer toe(s) (acquired), right foot: Secondary | ICD-10-CM | POA: Diagnosis not present

## 2024-02-21 DIAGNOSIS — L600 Ingrowing nail: Secondary | ICD-10-CM | POA: Diagnosis not present

## 2024-02-21 DIAGNOSIS — I739 Peripheral vascular disease, unspecified: Secondary | ICD-10-CM | POA: Diagnosis not present

## 2024-02-21 DIAGNOSIS — M2042 Other hammer toe(s) (acquired), left foot: Secondary | ICD-10-CM | POA: Diagnosis not present

## 2024-02-25 ENCOUNTER — Non-Acute Institutional Stay (SKILLED_NURSING_FACILITY): Payer: Self-pay

## 2024-02-25 DIAGNOSIS — E039 Hypothyroidism, unspecified: Secondary | ICD-10-CM | POA: Diagnosis not present

## 2024-02-25 DIAGNOSIS — I10 Essential (primary) hypertension: Secondary | ICD-10-CM | POA: Diagnosis not present

## 2024-02-25 DIAGNOSIS — R4189 Other symptoms and signs involving cognitive functions and awareness: Secondary | ICD-10-CM

## 2024-02-25 DIAGNOSIS — R29818 Other symptoms and signs involving the nervous system: Secondary | ICD-10-CM

## 2024-02-25 NOTE — Progress Notes (Signed)
 Location:  [facility] Room Number: 72 D Place of Service:  SNF (31)   CODE STATUS: Full Code  No Known Allergies  Chief Complaint  Patient presents with   Care Plan Meeting    HPI:  We have come together for her care plan meeting. Family present BIMS 15/15 mood 3/30: restless depression. She uses wheelchair without falls. She requires supervision to mod assist with her adl care. She is continent of bladder and bowel. Dietary: setup for meals regular diet with supplements; weight is 133. Appetite 51-100%. Therapy: none at this time. Activities: is active. She will continue to be followed for her chronic illnesses including:   Primary hypertension   Acquired hypothyroidism   Neurocognitive deficits  Past Medical History:  Diagnosis Date   Anxiety    Hyperlipidemia    Hypertension    Hypothyroidism    Parkinson's disease (HCC)    Tremor    Vertigo     Past Surgical History:  Procedure Laterality Date   DILATION AND CURETTAGE OF UTERUS     WISDOM TOOTH EXTRACTION      Social History   Socioeconomic History   Marital status: Married    Spouse name: Not on file   Number of children: Not on file   Years of education: Not on file   Highest education level: Not on file  Occupational History   Not on file  Tobacco Use   Smoking status: Former    Current packs/day: 0.00    Types: Cigarettes    Start date: 12/17/1947    Quit date: 12/16/1957    Years since quitting: 66.2   Smokeless tobacco: Never  Vaping Use   Vaping status: Never Used  Substance and Sexual Activity   Alcohol  use: No   Drug use: No   Sexual activity: Not Currently    Birth control/protection: Post-menopausal  Other Topics Concern   Not on file  Social History Narrative   Lives with husband   Social Drivers of Health   Financial Resource Strain: Low Risk  (07/31/2021)   Overall Financial Resource Strain (CARDIA)    Difficulty of Paying Living Expenses: Not hard at all   Food Insecurity: No Food Insecurity (11/11/2023)   Hunger Vital Sign    Worried About Running Out of Food in the Last Year: Never true    Ran Out of Food in the Last Year: Never true  Transportation Needs: No Transportation Needs (11/11/2023)   PRAPARE - Administrator, Civil Service (Medical): No    Lack of Transportation (Non-Medical): No  Physical Activity: Inactive (07/31/2021)   Exercise Vital Sign    Days of Exercise per Week: 0 days    Minutes of Exercise per Session: 0 min  Stress: No Stress Concern Present (07/31/2021)   [name]    Feeling of Stress : Only a little  Social Connections: Moderately Integrated (11/11/2023)   Social Connection and Isolation Panel    Frequency of Communication with Friends and Family: More than three times a week    Frequency of Social Gatherings with Friends and Family: Twice a week    Attends Religious Services: More than 4 times per year    Active Member of [employer] or Organizations: Yes    Attends Banker Meetings: More than 4 times per year    Marital Status: Widowed  Intimate Partner Violence: Unknown (11/11/2023)   Humiliation, Afraid, Rape, and Kick  questionnaire    Fear of Current or Ex-Partner: No    Emotionally Abused: No    Physically Abused: Not on file    Sexually Abused: No   Family History  Problem Relation Age of Onset   Hypertension Mother    Cancer Mother    CAD Father    Heart failure Sister    Heart failure Brother       VITAL SIGNS BP 100/62   Pulse 80   Temp (!) 96.9 F (36.1 C)   Ht 5' 2 (1.575 m)   Wt 133 lb 9.6 oz (60.6 kg)   SpO2 98%   BMI 24.44 kg/m   Outpatient Encounter Medications as of 02/25/2024  Medication Sig Note   acetaminophen  (TYLENOL ) 650 MG CR tablet Take 650 mg by mouth every 6 (six) hours.    carbidopa -levodopa  (SINEMET  IR) 25-100 MG tablet Take 1 tablet by mouth 3 (three) times daily before meals.     DULoxetine (CYMBALTA) 20 MG capsule Take 20 mg by mouth daily.    hydrochlorothiazide  (HYDRODIURIL ) 25 MG tablet Take by mouth. 11/11/2023: Pt has bottle of medication with her in a bag   levothyroxine  (SYNTHROID ) 100 MCG tablet Take 100 mcg by mouth every morning.    losartan  (COZAAR ) 100 MG tablet Take 100 mg by mouth daily.    ondansetron  (ZOFRAN -ODT) 4 MG disintegrating tablet Take 4 mg by mouth every 8 (eight) hours as needed for nausea or vomiting.    polyethylene glycol (MIRALAX  / GLYCOLAX ) 17 g packet Take 17 g by mouth daily.    traMADol  (ULTRAM ) 50 MG tablet Take by mouth every 6 (six) hours as needed. (Patient not taking: Reported on 02/25/2024)    No facility-administered encounter medications on file as of 02/25/2024.     SIGNIFICANT DIAGNOSTIC EXAMS  LABS REVIEWED:   11-11-23: wbc 12.3; hgb 14.2; hct 43.0; mcv 92.3 plt 259; glucose 111; bun 16 creat 0.92; k+ 3.5; na++ 137; ca 8.9; gfr >60; protein 6.5 albumin 3.7 11-12-23: wbc 7.3; hgb 13.1; hct 38.7; mcv 90.6 plt 234; glucose 100; bun 11; creat 0.82; k+ 3.7; na++ 136; ca 9.0; gfr >60 12-17-23: tsh 3.995  Review of Systems  Constitutional:  Negative for malaise/fatigue.  Respiratory:  Negative for cough and shortness of breath.   Cardiovascular:  Negative for chest pain, palpitations and leg swelling.  Gastrointestinal:  Negative for abdominal pain, constipation and heartburn.  Musculoskeletal:  Negative for back pain, joint pain and myalgias.  Skin: Negative.   Neurological:  Negative for dizziness.  Psychiatric/Behavioral:  The patient is not nervous/anxious.    Physical Exam Constitutional:      General: She is not in acute distress.    Appearance: She is well-developed. She is not diaphoretic.  Neck:     Thyroid : No thyromegaly.  Cardiovascular:     Rate and Rhythm: Normal rate and regular rhythm.     Heart sounds: Normal heart sounds.  Pulmonary:     Effort: Pulmonary effort is normal. No respiratory distress.      Breath sounds: Normal breath sounds.  Abdominal:     General: Bowel sounds are normal. There is no distension.     Palpations: Abdomen is soft.     Tenderness: There is no abdominal tenderness.  Musculoskeletal:        General: Normal range of motion.     Cervical back: Neck supple.     Right lower leg: Edema present.     Left lower leg: Edema  present.  Lymphadenopathy:     Cervical: No cervical adenopathy.  Skin:    General: Skin is warm and dry.  Neurological:     Mental Status: She is alert. Mental status is at baseline.  Psychiatric:        Mood and Affect: Mood normal.      ASSESSMENT/ PLAN:  TODAY  Primary hypertension Acquired hypothyroidism Neurocognitive deficits  Will continue current medications Will continue current plan of care Will continue to monitor her status.   Time spent with patient: 40 minutes: medications; dietary; plan of care.      Barnie Seip NP [facility] [place] [facility]   call [phone]

## 2024-03-09 ENCOUNTER — Non-Acute Institutional Stay (SKILLED_NURSING_FACILITY): Payer: Self-pay | Admitting: Adult Health

## 2024-03-09 ENCOUNTER — Encounter: Payer: Self-pay | Admitting: Adult Health

## 2024-03-09 DIAGNOSIS — G20A1 Parkinson's disease without dyskinesia, without mention of fluctuations: Secondary | ICD-10-CM

## 2024-03-09 DIAGNOSIS — R4189 Other symptoms and signs involving cognitive functions and awareness: Secondary | ICD-10-CM | POA: Diagnosis not present

## 2024-03-09 DIAGNOSIS — R29818 Other symptoms and signs involving the nervous system: Secondary | ICD-10-CM | POA: Diagnosis not present

## 2024-03-09 DIAGNOSIS — K5909 Other constipation: Secondary | ICD-10-CM | POA: Diagnosis not present

## 2024-03-09 NOTE — Progress Notes (Signed)
 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 There are no reports of uncontrolled pain. Her weight is stable at 133 pounds. Her blood pressure readings remain stable.    Past Medical History:  Diagnosis Date   Anxiety    Hyperlipidemia    Hypertension    Hypothyroidism    Parkinson's disease (HCC)    Tremor    Vertigo     Past Surgical History:  Procedure Laterality Date   DILATION AND CURETTAGE OF UTERUS     WISDOM TOOTH EXTRACTION      Social History   Socioeconomic History   Marital status: Married    Spouse name: Not on file   Number of children: Not on file   Years of education: Not on file   Highest education level: Not on file  Occupational History   Not on file  Tobacco Use   Smoking status: Former    Current packs/day: 0.00    Types: Cigarettes    Start date: 12/17/1947    Quit date: 12/16/1957    Years since quitting: 66.2   Smokeless tobacco: Never  Vaping Use   Vaping status: Never Used  Substance and Sexual Activity   Alcohol  use: No   Drug use: No   Sexual activity: Not Currently    Birth control/protection: Post-menopausal  Other Topics Concern   Not on file  Social History Narrative   Lives with husband   Social Drivers of Health   Financial Resource Strain: Low Risk  (07/31/2021)   Overall Financial Resource Strain (CARDIA)    Difficulty of Paying Living Expenses: Not hard at all  Food Insecurity: No Food  Insecurity (11/11/2023)   Hunger Vital Sign    Worried About Running Out of Food in the Last Year: Never true    Ran Out of Food in the Last Year: Never true  Transportation Needs: No Transportation Needs (11/11/2023)   PRAPARE - Administrator, Civil Service (Medical): No    Lack of Transportation (Non-Medical): No  Physical Activity: Inactive (07/31/2021)   Exercise Vital Sign    Days of Exercise per Week: 0 days    Minutes of Exercise per Session: 0 min  Stress: No Stress Concern Present (07/31/2021)   [name]    Feeling of Stress : Only a little  Social Connections: Moderately Integrated (11/11/2023)   Social Connection and Isolation Panel    Frequency of Communication with Friends and Family: More than three times a week    Frequency of Social Gatherings with Friends and Family: Twice a week    Attends Religious Services: More than 4 times per year    Active Member of [employer] or Organizations: Yes    Attends Banker Meetings: More than 4 times per year    Marital Status: Widowed  Intimate Partner Violence: Unknown (11/11/2023)   Humiliation, Afraid, Rape, and  Kick questionnaire    Fear of Current or Ex-Partner: No    Emotionally Abused: No    Physically Abused: Not on file    Sexually Abused: No   Family History  Problem Relation Age of Onset   Hypertension Mother    Cancer Mother    CAD Father    Heart failure Sister    Heart failure Brother       VITAL SIGNS BP 125/75   Pulse 90   Temp 97.9 F (36.6 C)   Resp 18   Ht 5' 2 (1.575 m)   Wt 133 lb 9.6 oz (60.6 kg)   SpO2 96%   BMI 24.44 kg/m   Outpatient Encounter Medications as of 03/09/2024  Medication Sig Note   acetaminophen  (TYLENOL ) 650 MG CR tablet Take 650 mg by mouth every 6 (six) hours.    carbidopa -levodopa  (SINEMET  IR) 25-100 MG tablet Take 1 tablet by mouth 3 (three) times daily before meals.    DULoxetine  (CYMBALTA) 20 MG capsule Take 20 mg by mouth daily.    hydrochlorothiazide  (HYDRODIURIL ) 25 MG tablet Take by mouth. 11/11/2023: Pt has bottle of medication with her in a bag   levothyroxine  (SYNTHROID ) 100 MCG tablet Take 100 mcg by mouth every morning.    losartan  (COZAAR ) 100 MG tablet Take 100 mg by mouth daily.    ondansetron  (ZOFRAN -ODT) 4 MG disintegrating tablet Take 4 mg by mouth every 8 (eight) hours as needed for nausea or vomiting.    polyethylene glycol (MIRALAX  / GLYCOLAX ) 17 g packet Take 17 g by mouth daily.    [DISCONTINUED] traMADol  (ULTRAM ) 50 MG tablet Take by mouth every 6 (six) hours as needed. (Patient not taking: Reported on 02/25/2024)    No facility-administered encounter medications on file as of 03/09/2024.     SIGNIFICANT DIAGNOSTIC EXAMS  LABS REVIEWED:   11-11-23: wbc 12.3; hgb 14.2; hct 43.0; mcv 92.3 plt 259; glucose 111; bun 16 creat 0.92; k+ 3.5; na++ 137; ca 8.9; gfr >60; protein 6.5 albumin 3.7 11-12-23: wbc 7.3; hgb 13.1; hct 38.7; mcv 90.6 plt 234; glucose 100; bun 11; creat 0.82; k+ 3.7; na++ 136; ca 9.0; gfr >60 12-17-23: tsh 3.995  Review of Systems  Constitutional:  Negative for malaise/fatigue.  Respiratory:  Negative for cough and shortness of breath.   Cardiovascular:  Negative for chest pain, palpitations and leg swelling.  Gastrointestinal:  Negative for abdominal pain, constipation and heartburn.  Musculoskeletal:  Negative for back pain, joint pain and myalgias.  Skin: Negative.   Neurological:  Negative for dizziness.  Psychiatric/Behavioral:  The patient is not nervous/anxious.     Physical Exam Constitutional:      General: She is not in acute distress.    Appearance: She is well-developed. She is not diaphoretic.  Neck:     Thyroid : No thyromegaly.  Cardiovascular:     Rate and Rhythm: Normal rate and regular rhythm.     Heart sounds: Normal heart sounds.  Pulmonary:     Effort: Pulmonary effort is normal. No respiratory  distress.     Breath sounds: Normal breath sounds.  Abdominal:     General: Bowel sounds are normal. There is no distension.     Palpations: Abdomen is soft.     Tenderness: There is no abdominal tenderness.  Musculoskeletal:        General: Normal range of motion.     Cervical back: Neck supple.     Right lower leg: Edema present.  Left lower leg: Edema present.  Lymphadenopathy:     Cervical: No cervical adenopathy.  Skin:    General: Skin is warm and dry.  Neurological:     Mental Status: She is alert. Mental status is at baseline.  Psychiatric:        Mood and Affect: Mood normal.       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 Chronic constipation: will continue miralax  daily   3: neurocognitive deficits: SLUMS 25/30   PREVIOUS   4. Hygroma: is followed by Dr. Debby: will need repeat ct scan in the next 2 months  5. Major depression: is stable on cymbalta 20 mg daily   6. Acquired hypothyroidism: tsh 3.995 will continue synthroid  100 mcg daily   7. Primary hypertension: b/p 125/75 will continue hydrochlorothiazide  25 mg daily cozaar  100 mg daily   8. Chronic pain syndrome: will continue tylenol  650 mg every 6 hours      Barnie Seip NP [facility]   call [phone]

## 2024-03-21 ENCOUNTER — Encounter: Payer: Self-pay | Admitting: Internal Medicine

## 2024-03-21 ENCOUNTER — Non-Acute Institutional Stay (SKILLED_NURSING_FACILITY): Payer: Self-pay | Admitting: Internal Medicine

## 2024-03-21 DIAGNOSIS — J3089 Other allergic rhinitis: Secondary | ICD-10-CM | POA: Insufficient documentation

## 2024-03-21 DIAGNOSIS — E039 Hypothyroidism, unspecified: Secondary | ICD-10-CM | POA: Diagnosis not present

## 2024-03-21 DIAGNOSIS — G20A1 Parkinson's disease without dyskinesia, without mention of fluctuations: Secondary | ICD-10-CM | POA: Diagnosis not present

## 2024-03-21 DIAGNOSIS — I10 Essential (primary) hypertension: Secondary | ICD-10-CM | POA: Diagnosis not present

## 2024-03-21 NOTE — Progress Notes (Unsigned)
 NURSING HOME LOCATION:  [facility] ROOM NUMBER:  [id number]  CODE STATUS:  Full Code  PCP: Landy Barnie RAMAN, NP   This is a nursing facility follow up visit of chronic medical diagnoses to document compliance with Regulation 483.30 (c) in The Long Term Care Survey Manual Phase 2 which mandates caregiver visit ( visits can alternate among physician, PA or NP as per statutes) within 10 days of 30 days / 60 days/ 90 days post admission to SNF date  .  Interim medical record and care since last SNF visit was updated with review of diagnostic studies and change in clinical status since last visit were documented.  HPI: She is a permanent resident of this facility having been admitted after being hospitalized 7/24 - 11/17/2023 following mechanical fall in which she sustained an acute traumatic subdural hematoma.  Additionally she suffered left 3rd through 7th rib fractures & left comminuted scapular fracture.  The rib fractures were associated with a small hemopneumothorax and a fusion.  She was admitted to this facility on 7/30 for rehab; but she has become a long-term resident. Past medical history includes essential hypertension; Parkinson; dyslipidemia; history of vertigo; hypothyroidism; and anxiety disorder. Here at the SNF TSH was updated and found to be therapeutic at 3.995 on 8/29.  Labs prior to discharge 7/25 revealed normal CBC.  Chemistries were normal except for minimal hyperglycemia with peak glucose of 111.  Review of systems: She describes intermittent nasal dripping of clear material for years.  She has no other extrinsic or URI symptoms.  Her edema is worse during the day with leg dependency.  It is not associated with any paroxysmal nocturnal dyspnea.  She validates that she has been sad since her husband died in 2023-06-01 after 64 years of marriage.  She states that this has improved.  She feels her Parkinson's symptoms are adequately controlled on her shaky  pill.  Constitutional: No fever, significant weight change, fatigue  Eyes: No redness, discharge, pain, vision change ENT/mouth: No nasal congestion,  purulent discharge, earache, change in hearing, sore throat  Cardiovascular: No chest pain, palpitations, paroxysmal nocturnal dyspnea, claudication, edema  Respiratory: No cough, sputum production, hemoptysis, DOE, significant snoring, apnea   Gastrointestinal: No heartburn, dysphagia, abdominal pain, nausea /vomiting, rectal bleeding, melena, change in bowels Genitourinary: No dysuria, hematuria, pyuria, incontinence, nocturia Musculoskeletal: No joint stiffness, joint swelling, weakness, pain Dermatologic: No rash, pruritus, change in appearance of skin Neurologic: No dizziness, headache, syncope, seizures, numbness, tingling Psychiatric: No significant anxiety, depression, insomnia, anorexia Endocrine: No change in hair/skin/nails, excessive thirst, excessive hunger, excessive urination  Hematologic/lymphatic: No significant bruising, lymphadenopathy, abnormal bleeding Allergy/immunology: No itchy/watery eyes, significant sneezing, urticaria, angioedema  Physical exam:  Pertinent or positive findings: Hair is very fine.  She has bilateral ptosis.  Facies tend to be blank.  Grade 1/2 systolic murmur is present.  There is slight increase in S2.  There are minor rhonchi in the left lower lobe which resolved with deep inspirations.  Abdomen slightly protuberant.  She has 1/2+ edema at the sock line.  Pedal pulses are not palpable centrally.  She has interosseous wasting of the hands.  She has bilateral tremor of the hands. General appearance: Adequately nourished; no acute distress, increased work of breathing is present.   Lymphatic: No lymphadenopathy about the head, neck, axilla. Eyes: No conjunctival inflammation or lid edema is present. There is no scleral icterus. Ears:  External ear exam shows no significant lesions or deformities.  Nose:  External nasal examination shows no deformity or inflammation. Nasal mucosa are pink and moist without lesions, exudates Oral exam:  Lips and gums are healthy appearing. There is no oropharyngeal erythema or exudate. Neck:  No thyromegaly, masses, tenderness noted.    Heart:  Normal rate and regular rhythm. S1 and S2 normal without gallop, murmur, click, rub .  Lungs: Chest clear to auscultation without wheezes, rhonchi, rales, rubs. Abdomen: Bowel sounds are normal. Abdomen is soft and nontender with no organomegaly, hernias, masses. GU: Deferred  Extremities:  No cyanosis, clubbing, edema  Neurologic exam : Cn 2-7 intact Strength equal  in upper & lower extremities Balance, Rhomberg, finger to nose testing could not be completed due to clinical state Deep tendon reflexes are equal Skin: Warm & dry w/o tenting. No significant lesions or rash.  See summary under each active problem in the Problem List with associated updated therapeutic plan:

## 2024-03-21 NOTE — Assessment & Plan Note (Signed)
 Trial of intranasal fluticasone.

## 2024-03-21 NOTE — Assessment & Plan Note (Signed)
 BP controlled; no change in antihypertensive medications

## 2024-03-21 NOTE — Assessment & Plan Note (Signed)
 Subjective stability on present Sinemet  regimen.  No change indicated but continue to monitor.

## 2024-03-21 NOTE — Assessment & Plan Note (Signed)
 TSH was therapeutic at 3.995 on 12/17/2023; no change indicated.

## 2024-03-21 NOTE — Patient Instructions (Signed)
 See assessment and plan under each diagnosis in the problem list and acutely for this visit

## 2024-03-28 ENCOUNTER — Encounter: Payer: Self-pay | Admitting: Obstetrics & Gynecology

## 2024-03-28 ENCOUNTER — Ambulatory Visit: Admitting: Obstetrics & Gynecology

## 2024-03-28 VITALS — BP 144/84 | HR 87 | Ht 59.0 in | Wt 131.0 lb

## 2024-03-28 DIAGNOSIS — N8110 Cystocele, unspecified: Secondary | ICD-10-CM

## 2024-03-28 DIAGNOSIS — Z4689 Encounter for fitting and adjustment of other specified devices: Secondary | ICD-10-CM

## 2024-03-28 NOTE — Progress Notes (Signed)
 Chief Complaint  Patient presents with   Pessary Check    Blood pressure (!) 144/84, pulse 87, height 4' 11 (1.499 m), weight 131 lb (59.4 kg).  Amber Rosales presents today for routine follow up related to her pessary.   She uses a Milex ring with support #3 She reports no vaginal discharge and no vaginal bleeding   Likert scale(1 not bothersome -5 very bothersome)  :  1  Exam reveals no undue vaginal mucosal pressure of breakdown, no discharge and no vaginal bleeding.  Vaginal Epithelial Abnormality Classification System:   0 0    No abnormalities 1    Epithelial erythema 2    Granulation tissue 3    Epithelial break or erosion, 1 cm or less 4    Epithelial break or erosion, 1 cm or greater  The pessary is removed, cleaned and replaced without difficulty.      ICD-10-CM   1. Pessary maintenance: Milex ring with support #3, placed 01/10/22  Z46.89     2. Baden-Walker grade 3 cystocele  N81.10        Amber Rosales will be sen back in 4 months for continued follow up.  Vonn VEAR Inch, MD  03/28/2024 3:03 PM

## 2024-04-04 ENCOUNTER — Ambulatory Visit: Admitting: Obstetrics & Gynecology

## 2024-04-14 ENCOUNTER — Other Ambulatory Visit (HOSPITAL_COMMUNITY)
Admission: RE | Admit: 2024-04-14 | Discharge: 2024-04-14 | Disposition: A | Discharge status: Home / Self Care | Source: Skilled Nursing Facility | Attending: Adult Health | Admitting: Adult Health

## 2024-04-14 DIAGNOSIS — M6281 Muscle weakness (generalized): Secondary | ICD-10-CM | POA: Diagnosis present

## 2024-04-14 LAB — RESP PANEL BY RT-PCR (RSV, FLU A&B, COVID)  RVPGX2
Influenza A by PCR: NEGATIVE
Influenza B by PCR: NEGATIVE
Resp Syncytial Virus by PCR: NEGATIVE
SARS Coronavirus 2 by RT PCR: NEGATIVE

## 2024-04-15 ENCOUNTER — Other Ambulatory Visit (HOSPITAL_COMMUNITY)
Admission: RE | Admit: 2024-04-15 | Discharge: 2024-04-15 | Disposition: A | Source: Skilled Nursing Facility | Attending: Adult Health | Admitting: Adult Health

## 2024-04-15 DIAGNOSIS — I10 Essential (primary) hypertension: Secondary | ICD-10-CM | POA: Insufficient documentation

## 2024-04-15 LAB — BASIC METABOLIC PANEL WITH GFR
Anion gap: 8 (ref 5–15)
BUN: 23 mg/dL (ref 8–23)
CO2: 27 mmol/L (ref 22–32)
Calcium: 8.8 mg/dL — ABNORMAL LOW (ref 8.9–10.3)
Chloride: 100 mmol/L (ref 98–111)
Creatinine, Ser: 0.78 mg/dL (ref 0.44–1.00)
GFR, Estimated: >60 mL/min
Glucose, Bld: 87 mg/dL (ref 70–99)
Potassium: 3.4 mmol/L — ABNORMAL LOW (ref 3.5–5.1)
Sodium: 135 mmol/L (ref 135–145)

## 2024-04-16 ENCOUNTER — Other Ambulatory Visit (HOSPITAL_COMMUNITY)
Admission: RE | Admit: 2024-04-16 | Discharge: 2024-04-16 | Disposition: A | Discharge status: Home / Self Care | Source: Skilled Nursing Facility | Attending: Adult Health | Admitting: Adult Health

## 2024-04-16 DIAGNOSIS — U071 COVID-19: Secondary | ICD-10-CM | POA: Diagnosis present

## 2024-04-16 DIAGNOSIS — J1100 Influenza due to unidentified influenza virus with unspecified type of pneumonia: Secondary | ICD-10-CM | POA: Diagnosis present

## 2024-04-16 LAB — RESP PANEL BY RT-PCR (RSV, FLU A&B, COVID)  RVPGX2
Influenza A by PCR: NEGATIVE
Influenza B by PCR: NEGATIVE
Resp Syncytial Virus by PCR: NEGATIVE
SARS Coronavirus 2 by RT PCR: NEGATIVE

## 2024-05-02 ENCOUNTER — Encounter: Payer: Self-pay | Admitting: Adult Health

## 2024-05-02 ENCOUNTER — Non-Acute Institutional Stay (SKILLED_NURSING_FACILITY): Payer: Self-pay | Admitting: Adult Health

## 2024-05-02 ENCOUNTER — Other Ambulatory Visit (HOSPITAL_COMMUNITY): Payer: Self-pay | Admitting: Internal Medicine

## 2024-05-02 DIAGNOSIS — G9609 Other spinal cerebrospinal fluid leak: Secondary | ICD-10-CM

## 2024-05-02 DIAGNOSIS — F321 Major depressive disorder, single episode, moderate: Secondary | ICD-10-CM | POA: Diagnosis not present

## 2024-05-02 DIAGNOSIS — D181 Lymphangioma, any site: Secondary | ICD-10-CM

## 2024-05-02 DIAGNOSIS — E039 Hypothyroidism, unspecified: Secondary | ICD-10-CM

## 2024-05-02 NOTE — Progress Notes (Signed)
 " Location:  [facility] Room Number: 118 Place of Service:  SNF (31)   CODE STATUS: full   Allergies[1]  Chief Complaint  Patient presents with   Medical Management of Chronic Issues                 Hygroma:  Major depression: Acquired hypothyroidism    HPI:  She is a 84 y.o. long term resident of this facility being seen for the management of her chronic illnesses: Hygroma:  Major depression: Acquired hypothyroidism.  There are no reports of uncontrolled pain present. She needs her ct follow up for her hygroma. Her tsh is therapeutic. There are no reports of depressive thoughts.    Past Medical History:  Diagnosis Date   Anxiety    Hyperlipidemia    Hypertension    Hypothyroidism    Parkinson's disease (HCC)    Tremor    Vertigo     Past Surgical History:  Procedure Laterality Date   DILATION AND CURETTAGE OF UTERUS     WISDOM TOOTH EXTRACTION      Social History   Socioeconomic History   Marital status: Married    Spouse name: Not on file   Number of children: Not on file   Years of education: Not on file   Highest education level: Not on file  Occupational History   Not on file  Tobacco Use   Smoking status: Former    Current packs/day: 0.00    Types: Cigarettes    Start date: 12/17/1947    Quit date: 12/16/1957    Years since quitting: 66.4   Smokeless tobacco: Never  Vaping Use   Vaping status: Never Used  Substance and Sexual Activity   Alcohol  use: No   Drug use: No   Sexual activity: Not Currently    Birth control/protection: Post-menopausal  Other Topics Concern   Not on file  Social History Narrative   Lives with husband   Social Drivers of Health   Tobacco Use: Medium Risk (05/02/2024)   Patient History    Smoking Tobacco Use: Former    Smokeless Tobacco Use: Never    Passive Exposure: Not on Actuary Strain: Low Risk (07/31/2021)   Overall Financial Resource Strain (CARDIA)    Difficulty of  Paying Living Expenses: Not hard at all  Food Insecurity: No Food Insecurity (11/11/2023)   Epic    Worried About Radiation Protection Practitioner of Food in the Last Year: Never true    Ran Out of Food in the Last Year: Never true  Transportation Needs: No Transportation Needs (11/11/2023)   Epic    Lack of Transportation (Medical): No    Lack of Transportation (Non-Medical): No  Physical Activity: Inactive (07/31/2021)   Exercise Vital Sign    Days of Exercise per Week: 0 days    Minutes of Exercise per Session: 0 min  Stress: No Stress Concern Present (07/31/2021)   [name]    Feeling of Stress : Only a little  Social Connections: Moderately Integrated (11/11/2023)   Social Connection and Isolation Panel    Frequency of Communication with Friends and Family: More than three times a week    Frequency of Social Gatherings with Friends and Family: Twice a week    Attends Religious Services: More than 4 times per year    Active Member of [employer] or Organizations: Yes    Attends Banker Meetings: More than  4 times per year    Marital Status: Widowed  Intimate Partner Violence: Unknown (11/11/2023)   Epic    Fear of Current or Ex-Partner: No    Emotionally Abused: No    Physically Abused: Not on file    Sexually Abused: No  Depression (PHQ2-9): Low Risk (02/17/2024)   Depression (PHQ2-9)    PHQ-2 Score: 0  Alcohol  Screen: Low Risk (07/31/2021)   Alcohol  Screen    Last Alcohol  Screening Score (AUDIT): 0  Housing: Low Risk (11/11/2023)   Epic    Unable to Pay for Housing in the Last Year: No    Number of Times Moved in the Last Year: 0    Homeless in the Last Year: No  Utilities: Not At Risk (11/11/2023)   Epic    Threatened with loss of utilities: No  Health Literacy: Not on file   Family History  Problem Relation Age of Onset   Hypertension Mother    Cancer Mother    CAD Father    Heart failure Sister    Heart failure  Brother       VITAL SIGNS BP 129/78   Pulse 85   Temp (!) 97.3 F (36.3 C)   Resp 18   Ht 5' 2 (1.575 m)   Wt 133 lb 12.8 oz (60.7 kg)   SpO2 99%   BMI 24.47 kg/m   Outpatient Encounter Medications as of 05/02/2024  Medication Sig Note   acetaminophen  (TYLENOL ) 650 MG CR tablet Take 650 mg by mouth every 6 (six) hours.    carbidopa -levodopa  (SINEMET  IR) 25-100 MG tablet Take 1 tablet by mouth 3 (three) times daily before meals.    DULoxetine (CYMBALTA) 20 MG capsule Take 20 mg by mouth daily.    fluticasone (FLONASE) 50 MCG/ACT nasal spray Place into both nostrils.    hydrochlorothiazide  (HYDRODIURIL ) 25 MG tablet Take by mouth. 11/11/2023: Pt has bottle of medication with her in a bag   levothyroxine  (SYNTHROID ) 100 MCG tablet Take 100 mcg by mouth every morning.    losartan  (COZAAR ) 100 MG tablet Take 100 mg by mouth daily.    ondansetron  (ZOFRAN -ODT) 4 MG disintegrating tablet Take 4 mg by mouth every 8 (eight) hours as needed for nausea or vomiting.    polyethylene glycol (MIRALAX  / GLYCOLAX ) 17 g packet Take 17 g by mouth daily.    No facility-administered encounter medications on file as of 05/02/2024.     SIGNIFICANT DIAGNOSTIC EXAMS  LABS REVIEWED:   11-11-23: wbc 12.3; hgb 14.2; hct 43.0; mcv 92.3 plt 259; glucose 111; bun 16 creat 0.92; k+ 3.5; na++ 137; ca 8.9; gfr >60; protein 6.5 albumin 3.7 11-12-23: wbc 7.3; hgb 13.1; hct 38.7; mcv 90.6 plt 234; glucose 100; bun 11; creat 0.82; k+ 3.7; na++ 136; ca 9.0; gfr >60 12-17-23: tsh 3.995  TODAY  04-15-24: glucose 87; bun 23; creat 0.78; k+ 3.4; na++ 135; ca 8.8 gfr >60   Review of Systems  Constitutional:  Negative for malaise/fatigue.  Respiratory:  Negative for cough and shortness of breath.   Cardiovascular:  Negative for chest pain, palpitations and leg swelling.  Gastrointestinal:  Negative for abdominal pain, constipation and heartburn.  Musculoskeletal:  Negative for back pain, joint pain and myalgias.   Skin: Negative.   Neurological:  Negative for dizziness.  Psychiatric/Behavioral:  The patient is not nervous/anxious.     Physical Exam Constitutional:      General: She is not in acute distress.    Appearance: She  is well-developed. She is not diaphoretic.  Neck:     Thyroid : No thyromegaly.  Cardiovascular:     Rate and Rhythm: Normal rate and regular rhythm.     Pulses: Normal pulses.     Heart sounds: Normal heart sounds.  Pulmonary:     Effort: Pulmonary effort is normal. No respiratory distress.     Breath sounds: Normal breath sounds.  Abdominal:     General: Bowel sounds are normal. There is no distension.     Palpations: Abdomen is soft.     Tenderness: There is no abdominal tenderness.  Musculoskeletal:        General: Normal range of motion.     Cervical back: Neck supple.     Right lower leg: Edema present.     Left lower leg: Edema present.  Lymphadenopathy:     Cervical: No cervical adenopathy.  Skin:    General: Skin is warm and dry.  Neurological:     Mental Status: She is alert. Mental status is at baseline.  Psychiatric:        Mood and Affect: Mood normal.       ASSESSMENT/ PLAN:  TODAY  Hygroma: is followed by Dr. Debby will setup a follow up ct scan  2. Major depression: is stable takes cymbalta 20 mg daily   3. Acquired hypothyroidism: tsh 3.995 will continue synthroid  100 mcg daily    PREVIOUS   4. Primary hypertension: b/p 129/78 will continue hydrochlorothiazide  25 mg daily cozaar  100 mg daily   5. Chronic pain syndrome: will continue tylenol  650 mg every 6 hours   6. 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 Chronic constipation: will continue miralax  daily   8: neurocognitive deficits: SLUMS 25/30  Will check cbc; cmp    Barnie Seip NP [facility]   call [phone]     [1] No Known Allergies  "

## 2024-05-04 ENCOUNTER — Ambulatory Visit (HOSPITAL_COMMUNITY)
Admission: RE | Admit: 2024-05-04 | Discharge: 2024-05-04 | Disposition: A | Discharge status: Home / Self Care | Source: Ambulatory Visit | Attending: Internal Medicine | Admitting: Internal Medicine

## 2024-05-04 ENCOUNTER — Other Ambulatory Visit (HOSPITAL_COMMUNITY)
Admission: RE | Admit: 2024-05-04 | Discharge: 2024-05-04 | Disposition: A | Source: Skilled Nursing Facility | Attending: Adult Health | Admitting: Adult Health

## 2024-05-04 DIAGNOSIS — I10 Essential (primary) hypertension: Secondary | ICD-10-CM | POA: Diagnosis present

## 2024-05-04 DIAGNOSIS — G9609 Other spinal cerebrospinal fluid leak: Secondary | ICD-10-CM | POA: Diagnosis present

## 2024-05-04 LAB — COMPREHENSIVE METABOLIC PANEL WITH GFR
ALT: 6 U/L (ref 0–44)
AST: 19 U/L (ref 15–41)
Albumin: 3.5 g/dL (ref 3.5–5.0)
Alkaline Phosphatase: 182 U/L — ABNORMAL HIGH (ref 38–126)
Anion gap: 10 (ref 5–15)
BUN: 24 mg/dL — ABNORMAL HIGH (ref 8–23)
CO2: 26 mmol/L (ref 22–32)
Calcium: 8.7 mg/dL — ABNORMAL LOW (ref 8.9–10.3)
Chloride: 103 mmol/L (ref 98–111)
Creatinine, Ser: 0.71 mg/dL (ref 0.44–1.00)
GFR, Estimated: >60 mL/min
Glucose, Bld: 79 mg/dL (ref 70–99)
Potassium: 3.9 mmol/L (ref 3.5–5.1)
Sodium: 140 mmol/L (ref 135–145)
Total Bilirubin: 0.2 mg/dL (ref 0.0–1.2)
Total Protein: 5.4 g/dL — ABNORMAL LOW (ref 6.5–8.1)

## 2024-05-04 LAB — CBC
HCT: 36 % (ref 36.0–46.0)
Hemoglobin: 11.7 g/dL — ABNORMAL LOW (ref 12.0–15.0)
MCH: 30.5 pg (ref 26.0–34.0)
MCHC: 32.5 g/dL (ref 30.0–36.0)
MCV: 94 fL (ref 80.0–100.0)
Platelets: 256 K/uL (ref 150–400)
RBC: 3.83 MIL/uL — ABNORMAL LOW (ref 3.87–5.11)
RDW: 12.8 % (ref 11.5–15.5)
WBC: 5.4 K/uL (ref 4.0–10.5)
nRBC: 0 % (ref 0.0–0.2)

## 2024-05-11 ENCOUNTER — Encounter (HOSPITAL_COMMUNITY)
Admission: RE | Admit: 2024-05-11 | Discharge: 2024-05-11 | Disposition: A | Discharge status: Home / Self Care | Source: Ambulatory Visit | Attending: Ophthalmology | Admitting: Ophthalmology

## 2024-05-23 ENCOUNTER — Encounter (HOSPITAL_COMMUNITY)

## 2024-05-24 ENCOUNTER — Encounter (HOSPITAL_COMMUNITY): Admission: RE | Admit: 2024-05-24 | Discharge: 2024-05-24 | Attending: Ophthalmology

## 2024-05-24 NOTE — Pre-Procedure Instructions (Signed)
 Attempted pre-op phonecall. Left VM for her to call us  back.

## 2024-05-25 NOTE — H&P (Signed)
 Surgical History & Physical  Patient Name: Amber Rosales  DOB: 07-09-1940  Surgery: Cataract extraction with intraocular lens implant phacoemulsification; Left Eye Surgeon: Lynwood Hermann MD Surgery Date: 05/29/2024 Pre-Op Date: 04/27/2024  HPI: A 59 Yr. old female patient 1. The patient complains of difficulty when reading fine print, books, newspaper, instructions etc., which began 6 months ago. Both eyes are affected. The episode is gradual. The blurry vision is worsening. The patient describes foggy and hazy symptoms affecting their eyes/vision. This is negatively affecting the patient's quality of life and the patient is unable to function adequately in life with the current level of vision. HPI Completed by Dr. Lynwood Hermann  Medical History: Cataracts High Blood Pressure Parkinson's, depressive disorder, Hyperlipidemia Stroke Thyroid  Problems  Review of Systems Cardiovascular High Blood Pressure Endocrine Hyperthyroidism Neurological Stroke, Depressive disorder  Social Never smoked tobacco  Medication Prednisolone acetate, Ilevro, Moxifloxacin,  Carbidopa -levodopa , Duloxetine, Hydrochlorothiazide , Levothyroxine , Losartan , Miralax   Sx/Procedures None  Drug Allergies  NKDA  History & Physical: Heent: cataracts NECK: supple without bruits LUNGS: lungs clear to auscultation CV: regular rate and rhythm Abdomen: soft and non-tender  Impression & Plan: Assessment: 1.  COMBINED FORMS AGE RELATED CATARACT; Both Eyes (H25.813) 2.  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  BLEPHARITIS; Right Upper Lid, Right Lower Lid, Left Upper Lid, Left Lower Lid (H01.001, H01.002,H01.004,H01.005) 4.  DERMATOCHALASIS, no surgery; Right Upper Lid, Left Upper Lid (H02.831, H02.834) 5.  Pinguecula; Both Eyes (H11.153) 6.  ASTIGMATISM, REGULAR; Both Eyes (H52.223)  Plan: 1.  Cataract accounts for the patient's decreased vision. This visual impairment is not correctable with a tolerable  change in glasses or contact lenses. Cataract surgery with an implantation of a new lens should significantly improve the visual and functional status of the patient. Recommend phacoemulsification with intraocular lens. Discussed all risks, benefits, alternatives, and potential complications. Discussed the procedures and recovery. The patient desires to have surgery. A-scan/Biometry ordered and will be performed for intraocular lens calculations. The surgery will be performed in order to improve vision for driving, reading, and for eye examinations. Recommend Dextenza  for post-operative pain and inflammation. Educational materials provided: Cataract. History of corneal refractive Surgery: None History of Previous Ocular Surgery (PPV, other): None History of ocular trauma: None Use of Eye Pressure Lowering Drops: None No current contact lens use. Pupil Status: Dilates well - shugarcaine or Lidocaine+Omidira by protocol Left Eye worse. OS first, then OD.  Wheelchair pt but able to transfer if needed.  2.  Discussed diagnosis in detail with patient. No treatment is required at this time. Will continue to observe condition and or symptoms.  3.  Blepharitis is present - recommend regular lid cleaning.  4.  Asymptomatic, recommend observation for now. Findings, prognosis and treatment options reviewed.  5.  Observe; Artificial tears as needed for irritation.  6.  Explained astigmatism to patient. Mild.

## 2024-05-25 NOTE — Patient Instructions (Signed)
" ° °   Lundyn Coste  05/25/2024     @PREFPERIOPPHARMACY @   Your procedure is scheduled on  05/29/2024.   Report to [facility] at  0900 A.M.   Call this number if you have problems the morning of surgery:  [phone]  If you experience any cold or flu symptoms such as cough, fever, chills, shortness of breath, etc. between now and your scheduled surgery, please notify us  at the above number.   Remember:  Do not eat or drink after midnight.      Take these medicines the morning of surgery with A SIP OF WATER                                                    duloxetine,sinemet , levothyroxine .     Do not wear jewelry, make-up or nail polish, including gel polish,  artificial nails, or any other type of covering on natural nails (fingers and  toes).  Do not wear lotions, powders, or perfumes, or deodorant.  Do not shave 48 hours prior to surgery.  Men may shave face and neck.  Do not bring valuables to the hospital.  [facility] is not responsible for any belongings or valuables.  Contacts, dentures or bridgework may not be worn into surgery.  Leave your suitcase in the car.  After surgery it may be brought to your room.  For patients admitted to the hospital, discharge time will be determined by your treatment team.  Patients discharged the day of surgery will not be allowed to drive home.      Please read over the following fact sheets that you were given. Anesthesia Post-op Instructions and Care and Recovery After Surgery       "

## 2024-05-25 NOTE — Pre-Procedure Instructions (Signed)
 Attempted pre-op phone call. Got daughter, Asberry on phone and she states her Mom is @ [employer]. She will meet patient here the day of surgery. I attempted to call [facility] and got their VM. I left them a detailed message and faxed preop information to them as well.

## 2024-05-29 ENCOUNTER — Encounter (HOSPITAL_COMMUNITY): Admission: RE | Payer: Self-pay | Source: Home / Self Care

## 2024-05-29 ENCOUNTER — Ambulatory Visit (HOSPITAL_COMMUNITY): Admission: RE | Admit: 2024-05-29 | Payer: Self-pay | Source: Home / Self Care | Admitting: Ophthalmology

## 2024-06-06 ENCOUNTER — Encounter (HOSPITAL_COMMUNITY)

## 2024-06-12 ENCOUNTER — Ambulatory Visit (HOSPITAL_COMMUNITY): Admit: 2024-06-12 | Payer: Self-pay | Admitting: Ophthalmology
